# Patient Record
Sex: Female | Born: 1980
Health system: Southern US, Community
[De-identification: ages and names within clinical notes are randomized; demographics above are authoritative.]

## PROBLEM LIST (undated history)

## (undated) DIAGNOSIS — D649 Anemia, unspecified: Secondary | ICD-10-CM

## (undated) DIAGNOSIS — Z8489 Family history of other specified conditions: Secondary | ICD-10-CM

## (undated) DIAGNOSIS — R112 Nausea with vomiting, unspecified: Secondary | ICD-10-CM

## (undated) DIAGNOSIS — Z9889 Other specified postprocedural states: Secondary | ICD-10-CM

## (undated) HISTORY — PX: WISDOM TOOTH EXTRACTION: SHX21

## (undated) HISTORY — PX: MANDIBLE SURGERY: SHX707

---

## 2014-10-25 DIAGNOSIS — C4491 Basal cell carcinoma of skin, unspecified: Secondary | ICD-10-CM

## 2014-10-25 HISTORY — DX: Basal cell carcinoma of skin, unspecified: C44.91

## 2015-03-10 ENCOUNTER — Encounter (HOSPITAL_BASED_OUTPATIENT_CLINIC_OR_DEPARTMENT_OTHER): Payer: BLUE CROSS/BLUE SHIELD | Attending: Internal Medicine

## 2015-03-10 DIAGNOSIS — M868X8 Other osteomyelitis, other site: Secondary | ICD-10-CM | POA: Insufficient documentation

## 2019-07-13 ENCOUNTER — Inpatient Hospital Stay: Admission: RE | Admit: 2019-07-13 | Payer: BLUE CROSS/BLUE SHIELD | Source: Ambulatory Visit

## 2019-07-13 NOTE — H&P (Signed)
Sonia Conley is a 39 y.o. female here for Menstrual Problem .pt here for follow up for a long history of menorrhagia   Sonia Conley a 39 y.o.femalehere for Rf by Wayland Denis, PA-menorrhagia (Abnormal, heavy bleeding since July with pain.) Consult from Wayland Denis , PA.pt with a long h/o of aub from age 5 . Was on OCPs then stopped for year and she restarted on loestrin equivalent . She does have intermittent dyspareunia . Currently has 7 day cycles , not excessive  Recent u/s showed small fibroid 1 cm And right ovarian cyst 3 cm( decreased from last u/s  02/2019) She has been on Seasonale  For the past month bleed from 04/29/2019 to 05/29/2019   EMBX : neg   Past Medical History:  has a past medical history of Ovarian cyst.  Past Surgical History:  has a past surgical history that includes other surgery (02/09/2015). Family History: family history includes Allergies in her father and mother; Cancer in her maternal grandfather; Coronary Artery Disease (Blocked arteries around heart) in her father; Diabetes type II in her father; Glaucoma in her mother; Heart disease in her mother; High blood pressure (Hypertension) in her father; Hyperlipidemia (Elevated cholesterol) in her father; Myocardial Infarction (Heart attack) (age of onset: 97) in her father; No Known Problems in her brother and maternal grandmother; Ovarian cancer (age of onset: 3) in her paternal aunt; Ovarian cancer (age of onset: 49) in her sister; Ovarian cancer (age of onset: 22) in her paternal grandmother. Social History:  reports that she has never smoked. She has never used smokeless tobacco. She reports that she does not drink alcohol or use drugs. OB/GYN History:          OB History    Gravida  0   Para  0   Term  0   Preterm  0   AB  0   Living  0     SAB  0   TAB  0   Ectopic  0   Molar  0   Multiple  0   Live Births  0          Allergies: is allergic to penicillins and latex,  natural rubber. Medications:  Current Outpatient Medications:  .  fluticasone (FLONASE) 50 mcg/actuation nasal spray, Place 1 spray into both nostrils 2 (two) times daily, Disp: 16 g, Rfl: 0 .  latanoprost (XALATAN) 0.005 % ophthalmic solution, Place 1 drop into both eyes as directed. Reported on 06/09/2015 , Disp: , Rfl:  .  levonorgestrel-ethinyl estradiol (SEASONALE) 0.15 mg-30 mcg (91) tablet, Take 1 tablet by mouth once daily, Disp: 84 tablet, Rfl: 3 .  ondansetron (ZOFRAN) 8 MG tablet, Take 1 tablet (8 mg total) by mouth every 6 (six) hours as needed for Nausea, Disp: 20 tablet, Rfl: 0 .  montelukast (SINGULAIR) 10 mg tablet, Take 1 tablet (10 mg total) by mouth nightly, Disp: 30 tablet, Rfl: 1  Review of Systems: General:                      No fatigue or weight loss Eyes:                           No vision changes Ears:                            No hearing difficulty Respiratory:  No cough or shortness of breath Pulmonary:                  No asthma or shortness of breath Cardiovascular:           No chest pain, palpitations, dyspnea on exertion Gastrointestinal:          No abdominal bloating, chronic diarrhea, constipations, masses, pain or hematochezia Genitourinary:             No hematuria, dysuria, abnormal vaginal discharge, pelvic pain, Menometrorrhagia Lymphatic:                   No swollen lymph nodes Musculoskeletal:         No muscle weakness Neurologic:                  No extremity weakness, syncope, seizure disorder Psychiatric:                  No history of depression, delusions or suicidal/homicidal ideation    Exam:      Vitals:   06/03/19 1418  BP: 137/81  Pulse: 82    Body mass index is 28.9 kg/m.  WDWN white/female in NAD   Lungs: CTA  CV : RRR without murmur   Neck:  no thyromegaly Abdomen: soft , no mass, normal active bowel sounds,  non-tender, no rebound tenderness Pelvic: tanner stage 5 ,  External genitalia:  vulva /labia no lesions Urethra: no prolapse Vagina: normal physiologic d/c, adequate room for LAVH  Cervix: no lesions, no cervical motion tenderness   Uterus: normal size shape and contour, non-tender Adnexa: no mass,  non-tender     Impression:   The encounter diagnosis was Irregular menstrual bleeding, unspecified,. Bleeding not controlled with conservative management   Plan:   Pt elects for definitive surgery  ie LAVH  + bilateral salpingectomy  Benefits and risks to surgery: The proposed benefit of the surgery has been discussed with the patient. The possible risks include, but are not limited to: organ injury to the bowel , bladder, ureters, and major blood vessels and nerves. There is a possibility of additional surgeries resulting from these injuries. There is also the risk of blood transfusion and the need to receive blood products during or after the procedure which may rarely lead to HIV or Hepatitis C infection. There is a risk of developing a deep venous thrombosis or a pulmonary embolism . There is the possibility of wound infection and also anesthetic complications, even the rare possibility of death. The patient understands these risks and wishes to proceed. All questions have been answered  Information sheet given to the patient that explain the  surgery     Caroline Sauger, MD       Electronically signed by Devika Dragovich, Burman Blacksmith, MD

## 2019-07-14 ENCOUNTER — Other Ambulatory Visit: Payer: Self-pay

## 2019-07-14 ENCOUNTER — Encounter
Admission: RE | Admit: 2019-07-14 | Discharge: 2019-07-14 | Disposition: A | Discharge status: Home / Self Care | Payer: BC Managed Care – PPO | Source: Ambulatory Visit | Attending: Obstetrics and Gynecology | Admitting: Obstetrics and Gynecology

## 2019-07-14 HISTORY — DX: Other specified postprocedural states: Z98.890

## 2019-07-14 HISTORY — DX: Other specified postprocedural states: R11.2

## 2019-07-14 HISTORY — DX: Anemia, unspecified: D64.9

## 2019-07-14 HISTORY — DX: Family history of other specified conditions: Z84.89

## 2019-07-14 NOTE — Patient Instructions (Addendum)
Your procedure is scheduled on: 07/27/19 Report to [facility]. To find out your arrival time please call [phone] between 1PM - 3PM on 07/24/19 .  Remember: Instructions that are not followed completely may result in serious medical risk, up to and including death, or upon the discretion of your surgeon and anesthesiologist your surgery may need to be rescheduled.     _X__ 1. Do not eat food after midnight the night before your procedure.                 No gum chewing or hard candies. You may drink clear liquids up to 2 hours                 before you are scheduled to arrive for your surgery- DO not drink clear                 liquids within 2 hours of the start of your surgery.                 Clear Liquids include:  water, apple juice without pulp, clear carbohydrate                 drink such as Clearfast or Gatorade, Black Coffee or Tea (Do not add                 anything to coffee or tea). Diabetics water only  __X__2.  On the morning of surgery brush your teeth with toothpaste and water, you                 may rinse your mouth with mouthwash if you wish.  Do not swallow any              toothpaste of mouthwash.     _X__ 3.  No Alcohol for 24 hours before or after surgery.   _X__ 4.  Do Not Smoke or use e-cigarettes For 24 Hours Prior to Your Surgery.                 Do not use any chewable tobacco products for at least 6 hours prior to                 surgery.  ____  5.  Bring all medications with you on the day of surgery if instructed.   __X__  6.  Notify your doctor if there is any change in your medical condition      (cold, fever, infections).     Do not wear jewelry, make-up, hairpins, clips or nail polish. Do not wear lotions, powders, or perfumes.  Do not shave 48 hours prior to surgery. Men may shave face and neck. Do not bring valuables to the hospital.    [facility] is not responsible for any belongings or  valuables.  Contacts, dentures/partials or body piercings may not be worn into surgery. Bring a case for your contacts, glasses or hearing aids, a denture cup will be supplied. Leave your suitcase in the car. After surgery it may be brought to your room. For patients admitted to the hospital, discharge time is determined by your treatment team.   Patients discharged the day of surgery will not be allowed to drive home.   Please read over the following fact sheets that you were given:   MRSA Information  __X__ Take these medicines the morning of surgery with A SIP OF WATER:  1. none  2.   3.   4.  5.  6.  ____ Fleet Enema (as directed)   __X__ Use CHG Soap/SAGE wipes as directed  ____ Use inhalers on the day of surgery  ____ Stop metformin/Janumet/Farxiga 2 days prior to surgery    ____ Take 1/2 of usual insulin dose the night before surgery. No insulin the morning          of surgery.   ____ Stop Blood Thinners Coumadin/Plavix/Xarelto/Pleta/Pradaxa/Eliquis/Effient/Aspirin  on   Or contact your Surgeon, Cardiologist or Medical Doctor regarding  ability to stop your blood thinners  __X__ Stop Anti-inflammatories 7 days before surgery such as Advil, Ibuprofen, Motrin,  BC or Goodies Powder, Naprosyn, Naproxen, Aleve, Aspirin    __X__ Stop all herbal supplements, fish oil or vitamin E until after surgery.    ____ Bring C-Pap to the hospital.     The Ensure pre surgery drink needs to be completed 2 hours before arrival. If you have any questions regarding the Incentive Spirometer bring the day of procedure. Your pre or post op nurse can assist with any questions.

## 2019-07-22 ENCOUNTER — Ambulatory Visit: Payer: BC Managed Care – PPO | Attending: Internal Medicine

## 2019-07-22 ENCOUNTER — Other Ambulatory Visit: Payer: Self-pay

## 2019-07-22 ENCOUNTER — Other Ambulatory Visit
Admission: RE | Admit: 2019-07-22 | Discharge: 2019-07-22 | Disposition: A | Discharge status: Home / Self Care | Payer: BC Managed Care – PPO | Source: Ambulatory Visit | Attending: Obstetrics and Gynecology | Admitting: Obstetrics and Gynecology

## 2019-07-22 DIAGNOSIS — Z01812 Encounter for preprocedural laboratory examination: Secondary | ICD-10-CM | POA: Insufficient documentation

## 2019-07-22 DIAGNOSIS — Z20822 Contact with and (suspected) exposure to covid-19: Secondary | ICD-10-CM | POA: Diagnosis not present

## 2019-07-22 DIAGNOSIS — N920 Excessive and frequent menstruation with regular cycle: Secondary | ICD-10-CM | POA: Insufficient documentation

## 2019-07-22 LAB — CBC
HCT: 44.3 % (ref 36.0–46.0)
Hemoglobin: 14.5 g/dL (ref 12.0–15.0)
MCH: 28.7 pg (ref 26.0–34.0)
MCHC: 32.7 g/dL (ref 30.0–36.0)
MCV: 87.5 fL (ref 80.0–100.0)
Platelets: 311 10*3/uL (ref 150–400)
RBC: 5.06 MIL/uL (ref 3.87–5.11)
RDW: 11.9 % (ref 11.5–15.5)
WBC: 7.9 10*3/uL (ref 4.0–10.5)
nRBC: 0 % (ref 0.0–0.2)

## 2019-07-22 LAB — BASIC METABOLIC PANEL
Anion gap: 9 (ref 5–15)
BUN: 9 mg/dL (ref 6–20)
CO2: 26 mmol/L (ref 22–32)
Calcium: 9.1 mg/dL (ref 8.9–10.3)
Chloride: 105 mmol/L (ref 98–111)
Creatinine, Ser: 0.77 mg/dL (ref 0.44–1.00)
GFR calc Af Amer: >60 mL/min (ref >60)
GFR calc non Af Amer: >60 mL/min (ref >60)
Glucose, Bld: 97 mg/dL (ref 70–99)
Potassium: 4.1 mmol/L (ref 3.5–5.1)
Sodium: 140 mmol/L (ref 135–145)

## 2019-07-22 LAB — TYPE AND SCREEN
ABO/RH(D): A POS
Antibody Screen: NEGATIVE

## 2019-07-22 LAB — SARS CORONAVIRUS 2 (TAT 6-24 HRS): SARS Coronavirus 2: NEGATIVE

## 2019-07-27 ENCOUNTER — Ambulatory Visit: Payer: BC Managed Care – PPO | Admitting: Anesthesiology

## 2019-07-27 ENCOUNTER — Ambulatory Visit
Admission: RE | Admit: 2019-07-27 | Discharge: 2019-07-27 | Disposition: A | Discharge status: Home / Self Care | Payer: BC Managed Care – PPO | Attending: Obstetrics and Gynecology | Admitting: Obstetrics and Gynecology

## 2019-07-27 ENCOUNTER — Other Ambulatory Visit: Payer: Self-pay

## 2019-07-27 ENCOUNTER — Encounter: Payer: Self-pay | Admitting: Obstetrics and Gynecology

## 2019-07-27 ENCOUNTER — Encounter
Admission: RE | Disposition: A | Discharge status: Home / Self Care | Payer: Self-pay | Source: Home / Self Care | Attending: Obstetrics and Gynecology

## 2019-07-27 DIAGNOSIS — N838 Other noninflammatory disorders of ovary, fallopian tube and broad ligament: Secondary | ICD-10-CM | POA: Diagnosis not present

## 2019-07-27 DIAGNOSIS — N941 Unspecified dyspareunia: Secondary | ICD-10-CM | POA: Diagnosis not present

## 2019-07-27 DIAGNOSIS — N920 Excessive and frequent menstruation with regular cycle: Secondary | ICD-10-CM | POA: Insufficient documentation

## 2019-07-27 DIAGNOSIS — Z79899 Other long term (current) drug therapy: Secondary | ICD-10-CM | POA: Insufficient documentation

## 2019-07-27 DIAGNOSIS — Z793 Long term (current) use of hormonal contraceptives: Secondary | ICD-10-CM | POA: Insufficient documentation

## 2019-07-27 DIAGNOSIS — Z91040 Latex allergy status: Secondary | ICD-10-CM | POA: Diagnosis not present

## 2019-07-27 DIAGNOSIS — N803 Endometriosis of pelvic peritoneum: Secondary | ICD-10-CM | POA: Diagnosis not present

## 2019-07-27 DIAGNOSIS — Z8041 Family history of malignant neoplasm of ovary: Secondary | ICD-10-CM | POA: Diagnosis not present

## 2019-07-27 DIAGNOSIS — Z880 Allergy status to penicillin: Secondary | ICD-10-CM | POA: Insufficient documentation

## 2019-07-27 DIAGNOSIS — D252 Subserosal leiomyoma of uterus: Secondary | ICD-10-CM | POA: Diagnosis not present

## 2019-07-27 HISTORY — PX: LAPAROSCOPIC VAGINAL HYSTERECTOMY WITH SALPINGECTOMY: SHX6680

## 2019-07-27 HISTORY — PX: EXCISION OF ENDOMETRIOMA: SHX6473

## 2019-07-27 LAB — ABO/RH: ABO/RH(D): A POS

## 2019-07-27 LAB — POCT PREGNANCY, URINE: Preg Test, Ur: NEGATIVE

## 2019-07-27 SURGERY — HYSTERECTOMY, VAGINAL, LAPAROSCOPY-ASSISTED, WITH SALPINGECTOMY
Anesthesia: General | Laterality: Bilateral

## 2019-07-27 MED ORDER — PROMETHAZINE HCL 25 MG/ML IJ SOLN
INTRAMUSCULAR | Status: AC
  Administered 2019-07-27: 6.25 mg via INTRAVENOUS
  Filled 2019-07-27: qty 1

## 2019-07-27 MED ORDER — LACTATED RINGERS IV SOLN: INTRAVENOUS | Status: DC

## 2019-07-27 MED ORDER — SUCCINYLCHOLINE CHLORIDE 20 MG/ML IJ SOLN
INTRAMUSCULAR | Status: DC | PRN
  Administered 2019-07-27: 100 mg via INTRAVENOUS

## 2019-07-27 MED ORDER — GABAPENTIN 300 MG PO CAPS
ORAL_CAPSULE | ORAL | Status: AC
  Filled 2019-07-27: qty 1

## 2019-07-27 MED ORDER — LIDOCAINE HCL (CARDIAC) PF 100 MG/5ML IV SOSY
PREFILLED_SYRINGE | INTRAVENOUS | Status: DC | PRN
  Administered 2019-07-27: 50 mg via INTRAVENOUS

## 2019-07-27 MED ORDER — GLYCOPYRROLATE 0.2 MG/ML IJ SOLN
INTRAMUSCULAR | Status: AC
  Filled 2019-07-27: qty 1

## 2019-07-27 MED ORDER — OXYCODONE-ACETAMINOPHEN 5-325 MG PO TABS: 1.0000 | ORAL_TABLET | Freq: Four times a day (QID) | ORAL | Status: DC | PRN

## 2019-07-27 MED ORDER — ROCURONIUM BROMIDE 100 MG/10ML IV SOLN
INTRAVENOUS | Status: DC | PRN
  Administered 2019-07-27: 35 mg via INTRAVENOUS
  Administered 2019-07-27: 5 mg via INTRAVENOUS

## 2019-07-27 MED ORDER — PROPOFOL 10 MG/ML IV BOLUS
INTRAVENOUS | Status: AC
  Filled 2019-07-27: qty 20

## 2019-07-27 MED ORDER — SUGAMMADEX SODIUM 200 MG/2ML IV SOLN
INTRAVENOUS | Status: DC | PRN
  Administered 2019-07-27: 200 mg via INTRAVENOUS

## 2019-07-27 MED ORDER — MIDAZOLAM HCL 2 MG/2ML IJ SOLN
INTRAMUSCULAR | Status: DC | PRN
  Administered 2019-07-27: 2 mg via INTRAVENOUS

## 2019-07-27 MED ORDER — FENTANYL CITRATE (PF) 100 MCG/2ML IJ SOLN
INTRAMUSCULAR | Status: AC
  Filled 2019-07-27: qty 2

## 2019-07-27 MED ORDER — FAMOTIDINE 20 MG PO TABS
ORAL_TABLET | ORAL | Status: AC
  Administered 2019-07-27: 20 mg
  Filled 2019-07-27: qty 1

## 2019-07-27 MED ORDER — PROMETHAZINE HCL 25 MG/ML IJ SOLN
6.2500 mg | INTRAMUSCULAR | Status: AC | PRN
Start: 2019-07-27 — End: 2019-07-27
  Administered 2019-07-27: 16:00:00 6.25 mg via INTRAVENOUS

## 2019-07-27 MED ORDER — SODIUM CHLORIDE FLUSH 0.9 % IV SOLN
INTRAVENOUS | Status: AC
  Filled 2019-07-27: qty 10

## 2019-07-27 MED ORDER — BUPIVACAINE HCL 0.5 % IJ SOLN
INTRAMUSCULAR | Status: DC | PRN
  Administered 2019-07-27: 9 mL

## 2019-07-27 MED ORDER — DEXAMETHASONE SODIUM PHOSPHATE 10 MG/ML IJ SOLN
INTRAMUSCULAR | Status: AC
  Filled 2019-07-27: qty 1

## 2019-07-27 MED ORDER — OXYCODONE HCL 5 MG PO TABS
ORAL_TABLET | ORAL | Status: AC
  Administered 2019-07-27: 5 mg via ORAL
  Filled 2019-07-27: qty 1

## 2019-07-27 MED ORDER — ONDANSETRON HCL 4 MG/2ML IJ SOLN
INTRAMUSCULAR | Status: DC | PRN
  Administered 2019-07-27: 4 mg via INTRAVENOUS

## 2019-07-27 MED ORDER — BUPIVACAINE HCL (PF) 0.5 % IJ SOLN
INTRAMUSCULAR | Status: AC
  Filled 2019-07-27: qty 30

## 2019-07-27 MED ORDER — LIDOCAINE HCL (PF) 2 % IJ SOLN
INTRAMUSCULAR | Status: AC
  Filled 2019-07-27: qty 10

## 2019-07-27 MED ORDER — ACETAMINOPHEN 500 MG PO TABS
1000.0000 mg | ORAL_TABLET | ORAL | Status: AC
  Administered 2019-07-27: 1000 mg via ORAL

## 2019-07-27 MED ORDER — DEXMEDETOMIDINE HCL IN NACL 200 MCG/50ML IV SOLN
INTRAVENOUS | Status: DC | PRN
  Administered 2019-07-27: 10 ug via INTRAVENOUS

## 2019-07-27 MED ORDER — GENTAMICIN SULFATE 40 MG/ML IJ SOLN
5.0000 mg/kg | INTRAVENOUS | Status: AC
  Administered 2019-07-27: 310 mg via INTRAVENOUS
  Filled 2019-07-27: qty 7.75

## 2019-07-27 MED ORDER — GLYCOPYRROLATE 0.2 MG/ML IJ SOLN
INTRAMUSCULAR | Status: DC | PRN
  Administered 2019-07-27: .2 mg via INTRAVENOUS

## 2019-07-27 MED ORDER — FENTANYL CITRATE (PF) 100 MCG/2ML IJ SOLN
INTRAMUSCULAR | Status: DC | PRN
  Administered 2019-07-27 (×3): 50 ug via INTRAVENOUS

## 2019-07-27 MED ORDER — ONDANSETRON HCL 4 MG/2ML IJ SOLN
INTRAMUSCULAR | Status: AC
  Filled 2019-07-27: qty 2

## 2019-07-27 MED ORDER — ACETAMINOPHEN 500 MG PO TABS
ORAL_TABLET | ORAL | Status: AC
  Filled 2019-07-27: qty 2

## 2019-07-27 MED ORDER — OXYCODONE HCL 5 MG PO TABS: 5.0000 mg | ORAL_TABLET | Freq: Once | ORAL | Status: AC

## 2019-07-27 MED ORDER — DEXAMETHASONE SODIUM PHOSPHATE 10 MG/ML IJ SOLN
INTRAMUSCULAR | Status: DC | PRN
  Administered 2019-07-27: 10 mg via INTRAVENOUS

## 2019-07-27 MED ORDER — MIDAZOLAM HCL 2 MG/2ML IJ SOLN
INTRAMUSCULAR | Status: AC
  Filled 2019-07-27: qty 2

## 2019-07-27 MED ORDER — KETOROLAC TROMETHAMINE 30 MG/ML IJ SOLN
INTRAMUSCULAR | Status: DC | PRN
  Administered 2019-07-27: 30 mg via INTRAVENOUS

## 2019-07-27 MED ORDER — CLINDAMYCIN PHOSPHATE 900 MG/50ML IV SOLN
INTRAVENOUS | Status: AC
  Filled 2019-07-27: qty 50

## 2019-07-27 MED ORDER — LIDOCAINE-EPINEPHRINE 1 %-1:100000 IJ SOLN
INTRAMUSCULAR | Status: DC | PRN
  Administered 2019-07-27: 9 mL

## 2019-07-27 MED ORDER — SUGAMMADEX SODIUM 200 MG/2ML IV SOLN
INTRAVENOUS | Status: AC
  Filled 2019-07-27: qty 2

## 2019-07-27 MED ORDER — SCOPOLAMINE 1 MG/3DAYS TD PT72
MEDICATED_PATCH | TRANSDERMAL | Status: AC
  Filled 2019-07-27: qty 1

## 2019-07-27 MED ORDER — ONDANSETRON HCL 4 MG/2ML IJ SOLN: 4.0000 mg | Freq: Four times a day (QID) | INTRAMUSCULAR | Status: DC | PRN

## 2019-07-27 MED ORDER — LIDOCAINE-EPINEPHRINE 1 %-1:100000 IJ SOLN
INTRAMUSCULAR | Status: AC
  Filled 2019-07-27: qty 1

## 2019-07-27 MED ORDER — SCOPOLAMINE 1 MG/3DAYS TD PT72
1.0000 | MEDICATED_PATCH | Freq: Once | TRANSDERMAL | Status: DC
  Administered 2019-07-27: 1.5 mg via TRANSDERMAL

## 2019-07-27 MED ORDER — CLINDAMYCIN PHOSPHATE 900 MG/50ML IV SOLN
900.0000 mg | INTRAVENOUS | Status: AC
  Administered 2019-07-27: 900 mg via INTRAVENOUS

## 2019-07-27 MED ORDER — KETOROLAC TROMETHAMINE 30 MG/ML IJ SOLN
INTRAMUSCULAR | Status: AC
  Filled 2019-07-27: qty 1

## 2019-07-27 MED ORDER — GABAPENTIN 300 MG PO CAPS
300.0000 mg | ORAL_CAPSULE | ORAL | Status: AC
  Administered 2019-07-27: 300 mg via ORAL

## 2019-07-27 MED ORDER — PROPOFOL 10 MG/ML IV BOLUS
INTRAVENOUS | Status: DC | PRN
  Administered 2019-07-27: 150 mg via INTRAVENOUS

## 2019-07-27 MED ORDER — FENTANYL CITRATE (PF) 100 MCG/2ML IJ SOLN: 25.0000 ug | INTRAMUSCULAR | Status: DC | PRN

## 2019-07-27 SURGICAL SUPPLY — 48 items
BAG URINE DRAIN 2000ML AR STRL (UROLOGICAL SUPPLIES) ×4 IMPLANT
BLADE SURG SZ11 CARB STEEL (BLADE) ×4 IMPLANT
CATH FOLEY 2WAY  5CC 16FR (CATHETERS) ×2
CATH ROBINSON RED A/P 16FR (CATHETERS) ×4 IMPLANT
CATH URTH 16FR FL 2W BLN LF (CATHETERS) ×2 IMPLANT
CHLORAPREP W/TINT 26 (MISCELLANEOUS) ×4 IMPLANT
CLOSURE WOUND 1/2 X4 (GAUZE/BANDAGES/DRESSINGS) ×1
COVER WAND RF STERILE (DRAPES) ×4 IMPLANT
DRAPE SURG 17X11 SM STRL (DRAPES) ×4 IMPLANT
DRSG TEGADERM 2-3/8X2-3/4 SM (GAUZE/BANDAGES/DRESSINGS) ×16 IMPLANT
ELECT REM PT RETURN 9FT ADLT (ELECTROSURGICAL) ×4
ELECTRODE REM PT RTRN 9FT ADLT (ELECTROSURGICAL) ×2 IMPLANT
FILTER LAP SMOKE EVAC STRL (MISCELLANEOUS) ×4 IMPLANT
GAUZE 4X4 16PLY RFD (DISPOSABLE) ×4 IMPLANT
GLOVE BIO SURGEON STRL SZ8 (GLOVE) ×16 IMPLANT
GOWN STRL REUS W/ TWL LRG LVL3 (GOWN DISPOSABLE) ×6 IMPLANT
GOWN STRL REUS W/ TWL XL LVL3 (GOWN DISPOSABLE) ×2 IMPLANT
GOWN STRL REUS W/TWL LRG LVL3 (GOWN DISPOSABLE) ×6
GOWN STRL REUS W/TWL XL LVL3 (GOWN DISPOSABLE) ×2
GRASPER SUT TROCAR 14GX15 (MISCELLANEOUS) IMPLANT
IRRIGATION STRYKERFLOW (MISCELLANEOUS) ×2 IMPLANT
IRRIGATOR STRYKERFLOW (MISCELLANEOUS) ×4
IV LACTATED RINGERS 1000ML (IV SOLUTION) ×4 IMPLANT
KIT PINK PAD W/HEAD ARE REST (MISCELLANEOUS) ×4
KIT PINK PAD W/HEAD ARM REST (MISCELLANEOUS) ×2 IMPLANT
KIT TURNOVER CYSTO (KITS) ×4 IMPLANT
LABEL OR SOLS (LABEL) ×4 IMPLANT
NEEDLE HYPO 22GX1.5 SAFETY (NEEDLE) ×4 IMPLANT
PACK BASIN MINOR ARMC (MISCELLANEOUS) ×4 IMPLANT
PACK GYN LAPAROSCOPIC (MISCELLANEOUS) ×4 IMPLANT
PAD OB MATERNITY 4.3X12.25 (PERSONAL CARE ITEMS) ×4 IMPLANT
SHEARS HARMONIC ACE PLUS 36CM (ENDOMECHANICALS) ×4 IMPLANT
SLEEVE ENDOPATH XCEL 5M (ENDOMECHANICALS) ×8 IMPLANT
SPONGE GAUZE 2X2 8PLY STER LF (GAUZE/BANDAGES/DRESSINGS) ×3
SPONGE GAUZE 2X2 8PLY STRL LF (GAUZE/BANDAGES/DRESSINGS) ×9 IMPLANT
STRIP CLOSURE SKIN 1/2X4 (GAUZE/BANDAGES/DRESSINGS) ×3 IMPLANT
SUT VIC AB 0 CT1 27 (SUTURE) ×4
SUT VIC AB 0 CT1 27XCR 8 STRN (SUTURE) ×4 IMPLANT
SUT VIC AB 0 CT1 36 (SUTURE) ×8 IMPLANT
SUT VIC AB 0 CT2 27 (SUTURE) ×4 IMPLANT
SUT VIC AB 2-0 UR6 27 (SUTURE) IMPLANT
SUT VIC AB 4-0 SH 27 (SUTURE) ×2
SUT VIC AB 4-0 SH 27XANBCTRL (SUTURE) ×2 IMPLANT
SUT VICRYL 2-0 SH 8X27 (SUTURE) ×4 IMPLANT
SYR 10ML LL (SYRINGE) ×4 IMPLANT
SYR CONTROL 10ML LL (SYRINGE) ×4 IMPLANT
TROCAR XCEL NON-BLD 5MMX100MML (ENDOMECHANICALS) ×4 IMPLANT
TUBING EVAC SMOKE HEATED PNEUM (TUBING) ×4 IMPLANT

## 2019-07-27 NOTE — Op Note (Signed)
NAME: DARLETTE, ROCIO MEDICAL RECORD [id number] ACCOUNT [id number] DATE OF BIRTH:1981-04-27 FACILITY: [facility] LOCATION: [facility] PHYSICIAN:Jaivion Kingsley Josefine Class, MD  OPERATIVE REPORT  DATE OF PROCEDURE:  07/27/2019  PREOPERATIVE DIAGNOSES: 1.  Menorrhagia. 2.  Dyspareunia.  POSTOPERATIVE DIAGNOSES: 1.  Menorrhagia. 2.  Dyspareunia. 3.  Peritoneal endometriosis.  PROCEDURES: 1.  Laparoscopic assisted vaginal hysterectomy. 2.  Bilateral salpingectomy. 3.  Excision of peritoneal endometriosis.  SURGEON:  Laverta Baltimore, MD.  FIRST ASSISTANT:  Dr. Leafy Ro  ANESTHESIA:  General endotracheal anesthesia.  INDICATIONS:  A 39 year old gravida 0 patient with a long history of menorrhagia and intermittent dyspareunia, failing conservative treatment.  FINDINGS:  Peritoneal scarring consistent with endometriosis, one area to the posterior right cul-de-sac and the other significant area anterior cul-de-sac on the right.  DESCRIPTION OF PROCEDURE:  After adequate general endotracheal anesthesia, the patient was placed in dorsal supine position, legs in the [place] stirrups.  The patient's abdomen, perineum and vagina were prepped and draped in normal sterile fashion.   Timeout was performed.  Straight catheterization of the bladder yielded 125 mL of clear urine.  A Foley catheter was kept in during the laparoscopic portion of the procedure.  Gloves were changed.  Attention was directed to the patient's abdomen where a  5 mm infraumbilical incision was made after injecting with 0.5% Marcaine.  The 5 mm laparoscope was advanced into the abdominal cavity under direct visualization with the Optiview cannula.  A second port site was placed in the left lower quadrant 3 cm  medial to the left anterior iliac spine.  Under direct visualization, the trocar was advanced.  A third port site was placed in the right lower quadrant, again 3 cm medial to the right anterior iliac spine and under  direct visualization, the trocar was  advanced.  Initial impression revealed several areas with scarification and powder burn areas consistent with endometriosis, a large area in the right anterior cul-de-sac and in the posterior cul-de-sac on the right as well overlying the track of the  ureter.  Excisional biopsies were then performed with the Harmonic scalpel with tenting the peritoneum away from the underneath structures and dissecting this endometriosis out.  Both specimens will be sent for pathologic evaluation.  Attention was then  directed to the patient's left fallopian tube, which was grasped with the fimbriated end and the mesosalpinx was dissected to the level of the cornua.  The uteroovarian ligament was then cauterized and transected, as well as the broad ligament.  The left  uterine artery was skeletonized after opening the bladder flap to the central portion of the cervix.  The uterine artery was then cauterized and transected with Harmonic scalpel.  Of note, the ureters were identified bilaterally with normal peristaltic  activity prior to dissection.  A similar procedure was performed on the patient's right side.  Again after picking up the fimbriated end of the right fallopian tube, the mesosalpinx was dissected free all the way to the level of the cornua.  The  uteroovarian ligament was cauterized, transected, as well as the broad ligament.  The right uterine artery was skeletonized, cauterized and transected.  Good hemostasis was noted.  The rest of the bladder flap was opened and the bladder was reflected off  the lower cervix.  Attention was then directed vaginally where the legs were placed steeply and a weighted speculum was placed in the posterior vaginal vault.  The Foley catheter was removed.  Cervix was grasped with 2 thyroid tenacula and injected with  1% lidocaine with 1:100,000 epinephrine.  A direct posterior colpotomy incision was made.  Upon entry into the posterior  cul-de-sac, some serosanguineous discharge was removed.  The uterosacral ligaments were bilaterally clamped, transected and suture  ligated for later identification.  The anterior cervix was circumferentially incised with the Bovie.  The anterior cul-de-sac was entered sharply without difficulty.  The cardinal ligaments were then bilaterally clamped, transected, suture ligated with 0  Vicryl suture and 1 additional bite on each side allowed for delivery of the cervix and the uterus and fallopian tubes.  Good hemostasis was noted.  Vaginal cuff was then closed with a running 0 Vicryl suture.  Good approximation of edges, good  hemostasis.  Gloves were changed.  Attention was directed back up to the abdominal cavity and the patient's abdomen was reinsufflated.  Good hemostasis was noted.  The previously biopsied peritoneal sites were hemostatic.  The ureters appeared normal  with normal peristaltic activity.  Irrigation was performed and no additional bleeding was noted.  Pressure was lowered to 7 mmHg.  Again, good hemostasis.  Upper abdomen appeared normal and the patient's abdomen was then deflated and the abdominal  incisions were closed with interrupted 4-0 Vicryl suture.  Sterile dressing applied.  The patient's bladder was catheterized at the end of the case, yielding an additional 25 mL of urine.  The patient tolerated the procedure well and was taken to  recovery room in good condition.  ESTIMATED BLOOD LOSS:  10 mL.  INTRAOPERATIVE FLUIDS:  700 mL.  URINE OUTPUT:  150 mL.  The patient did receive 30 mg of intravenous Tylenol prior to leaving the operating room.  VN/NUANCE  D:07/27/2019 T:07/27/2019 JOB:[id number]

## 2019-07-27 NOTE — Anesthesia Procedure Notes (Signed)
Procedure Name: Intubation Performed by: Merdis Snodgrass, CRNA Pre-anesthesia Checklist: Patient identified, Patient being monitored, Timeout performed, Emergency Drugs available and Suction available Patient Re-evaluated:Patient Re-evaluated prior to induction Oxygen Delivery Method: Circle system utilized Preoxygenation: Pre-oxygenation with 100% oxygen Induction Type: IV induction Ventilation: Mask ventilation without difficulty Laryngoscope Size: Miller and 2 Grade View: Grade I Tube type: Oral Tube size: 7.0 mm Number of attempts: 1 Airway Equipment and Method: Stylet Placement Confirmation: ETT inserted through vocal cords under direct vision,  positive ETCO2 and breath sounds checked- equal and bilateral Secured at: 21 cm Tube secured with: Tape Dental Injury: Teeth and Oropharynx as per pre-operative assessment        

## 2019-07-27 NOTE — Anesthesia Preprocedure Evaluation (Signed)
Anesthesia Evaluation  Patient identified by MRN, date of birth, ID band Patient awake    Reviewed: Allergy & Precautions, H&P , NPO status , Patient's Chart, lab work & pertinent test results, reviewed documented beta blocker date and time   History of Anesthesia Complications (+) PONV, Family history of anesthesia reaction and history of anesthetic complications  Airway Mallampati: I  TM Distance: >3 FB Neck ROM: full    Dental  (+) Dental Advidsory Given, Caps, Teeth Intact, Missing   Pulmonary neg pulmonary ROS,    Pulmonary exam normal        Cardiovascular Exercise Tolerance: Good negative cardio ROS Normal cardiovascular exam     Neuro/Psych negative neurological ROS  negative psych ROS   GI/Hepatic negative GI ROS, Neg liver ROS,   Endo/Other  negative endocrine ROS  Renal/GU negative Renal ROS  negative genitourinary   Musculoskeletal   Abdominal   Peds  Hematology  (+) Blood dyscrasia, anemia ,   Anesthesia Other Findings Past Medical History: No date: Anemia No date: Family history of adverse reaction to anesthesia     Comment:  grandmother PONV No date: PONV (postoperative nausea and vomiting)   Reproductive/Obstetrics negative OB ROS                             Anesthesia Physical Anesthesia Plan  ASA: I  Anesthesia Plan: General   Post-op Pain Management:    Induction: Intravenous  PONV Risk Score and Plan: 4 or greater and Ondansetron, Dexamethasone, Midazolam, Scopolamine patch - Pre-op, Promethazine and Treatment may vary due to age or medical condition  Airway Management Planned: Oral ETT  Additional Equipment:   Intra-op Plan:   Post-operative Plan: Extubation in OR  Informed Consent: I have reviewed the patients History and Physical, chart, labs and discussed the procedure including the risks, benefits and alternatives for the proposed anesthesia with  the patient or authorized representative who has indicated his/her understanding and acceptance.     Dental Advisory Given  Plan Discussed with: Anesthesiologist, CRNA and Surgeon  Anesthesia Plan Comments:         Anesthesia Quick Evaluation

## 2019-07-27 NOTE — Transfer of Care (Signed)
Immediate Anesthesia Transfer of Care Note  Patient: Sonia Conley  Procedure(s) Performed: LAPAROSCOPIC ASSISTED VAGINAL HYSTERECTOMY WITH SALPINGECTOMY (Bilateral )  Patient Location: PACU  Anesthesia Type:General  Level of Consciousness: sedated  Airway & Oxygen Therapy: Patient Spontanous Breathing and Patient connected to face mask oxygen  Post-op Assessment: Report given to RN and Post -op Vital signs reviewed and stable  Post vital signs: Reviewed  Last Vitals:  Vitals Value Taken Time  BP 101/54 07/27/19 1455  Temp    Pulse 73 07/27/19 1456  Resp 17 07/27/19 1456  SpO2 96 % 07/27/19 1456  Vitals shown include unvalidated device data.  Last Pain:  Vitals:   07/27/19 1117  TempSrc: Temporal  PainSc: 0-No pain         Complications: No apparent anesthesia complications

## 2019-07-27 NOTE — Discharge Instructions (Addendum)
Laparoscopically Assisted Vaginal Hysterectomy, Care After This sheet gives you information about how to care for yourself after your procedure. Your health care provider may also give you more specific instructions. If you have problems or questions, contact your health care provider. What can I expect after the procedure? After the procedure, it is common to have:  Soreness and numbness in your incision areas.  Abdominal pain. You will be given pain medicine to control it.  Vaginal bleeding and discharge. You will need to use a sanitary napkin after this procedure.  Sore throat from the breathing tube that was inserted during surgery. Follow these instructions at home: Medicines  Take over-the-counter and prescription medicines only as told by your health care provider.  Do not take aspirin or ibuprofen. These medicines can cause bleeding.  Do not drive or use heavy machinery while taking prescription pain medicine.  Do not drive for 24 hours if you were given a medicine to help you relax (sedative) during the procedure. Incision care   Follow instructions from your health care provider about how to take care of your incisions. Make sure you: ? Wash your hands with soap and water before you change your bandage (dressing). If soap and water are not available, use hand sanitizer. ? Change your dressing as told by your health care provider. ? Leave stitches (sutures), skin glue, or adhesive strips in place. These skin closures may need to stay in place for 2 weeks or longer. If adhesive strip edges start to loosen and curl up, you may trim the loose edges. Do not remove adhesive strips completely unless your health care provider tells you to do that.  Check your incision area every day for signs of infection. Check for: ? Redness, swelling, or pain. ? Fluid or blood. ? Warmth. ? Pus or a bad smell. Activity  Get regular exercise as told by your health care provider. You may be  told to take short walks every day and go farther each time.  Return to your normal activities as told by your health care provider. Ask your health care provider what activities are safe for you.  Do not douche, use tampons, or have sexual intercourse for at least 6 weeks, or until your health care provider gives you permission.  Do not lift anything that is heavier than 10 lb (4.5 kg), or the limit that your health care provider tells you, until he or she says that it is safe. General instructions  Do not take baths, swim, or use a hot tub until your health care provider approves. Take showers instead of baths.  Do not drive for 24 hours if you received a sedative.  Do not drive or operate heavy machinery while taking prescription pain medicine.  To prevent or treat constipation while you are taking prescription pain medicine, your health care provider may recommend that you: ? Drink enough fluid to keep your urine clear or pale yellow. ? Take over-the-counter or prescription medicines. ? Eat foods that are high in fiber, such as fresh fruits and vegetables, whole grains, and beans. ? Limit foods that are high in fat and processed sugars, such as fried and sweet foods.  Keep all follow-up visits as told by your health care provider. This is important. Contact a health care provider if:  You have signs of infection, such as: ? Redness, swelling, or pain around your incision sites. ? Fluid or blood coming from an incision. ? An incision that feels warm to the   touch. ? Pus or a bad smell coming from an incision.  Your incision breaks open.  Your pain medicine is not helping.  You feel dizzy or light-headed.  You have pain or bleeding when you urinate.  You have persistent nausea and vomiting.  You have blood, pus, or a bad-smelling discharge from your vagina. Get help right away if:  You have a fever.  You have severe abdominal pain.  You have chest pain.  You have  shortness of breath.  You faint.  You have pain, swelling, or redness in your leg.  You have heavy bleeding from your vagina. Summary  After the procedure, it is common to have abdominal pain and vaginal bleeding.  You should not drive or lift heavy objects until your health care provider says that it is safe.  Contact your health care provider if you have any symptoms of infection, excessive vaginal bleeding, nausea, vomiting, or shortness of breath. This information is not intended to replace advice given to you by your health care provider. Make sure you discuss any questions you have with your health care provider. Document Revised: 05/24/2017 Document Reviewed: 08/07/2016 Elsevier Patient Education  2020 [employer]   AMBULATORY SURGERY  DISCHARGE INSTRUCTIONS   1) The drugs that you were given will stay in your system until tomorrow so for the next 24 hours you should not:  A) Drive an automobile B) Make any legal decisions C) Drink any alcoholic beverage   2) You may resume regular meals tomorrow.  Today it is better to start with liquids and gradually work up to solid foods.  You may eat anything you prefer, but it is better to start with liquids, then soup and crackers, and gradually work up to solid foods.   3) Please notify your doctor immediately if you have any unusual bleeding, trouble breathing, redness and pain at the surgery site, drainage, fever, or pain not relieved by medication.    4) Additional Instructions:        Please contact your physician with any problems or Same Day Surgery at [phone], Monday through Friday 6 am to 4 pm, or [facility] [place] Main number at [phone]. 

## 2019-07-27 NOTE — Brief Op Note (Signed)
07/27/2019  2:39 PM  PATIENT:  Sonia Conley  39 y.o. female  PRE-OPERATIVE DIAGNOSIS:  Menorrhagia, dyspareunia   POST-OPERATIVE DIAGNOSIS:  Menorrhagia, dyspareunia , endometriosis  PROCEDURE:  Procedure(s): LAPAROSCOPIC ASSISTED VAGINAL HYSTERECTOMY WITH SALPINGECTOMY (Bilateral) Excision of endometriosis SURGEON:  Surgeon(s) and Role:    * Kari Kerth, Gwen Her, MD - Primary    * Benjaman Kindler, MD - Assisting  PHYSICIAN ASSISTANT: CST  ASSISTANTS: none   ANESTHESIA:   general  EBL:  10 mL , IOF 700cc  urine out : 150 cc  BLOOD ADMINISTERED:none  DRAINS: none   LOCAL MEDICATIONS USED:  MARCAINE     SPECIMEN:  Source of Specimen:  cx , uterus , bilateral fallopian tubes and 2 separate excsionall biopsy of endometriosis  DISPOSITION OF SPECIMEN:  PATHOLOGY  COUNTS:  YES  TOURNIQUET:  * No tourniquets in log *  DICTATION: .Other Dictation: Dictation Number verbal  PLAN OF CARE: Discharge to home after PACU  PATIENT DISPOSITION:  PACU - hemodynamically stable.   Delay start of Pharmacological VTE agent (>24hrs) due to surgical blood loss or risk of bleeding: not applicable

## 2019-07-27 NOTE — Progress Notes (Signed)
Pt is ready for surgery . LAbs reviewed . All questions answered

## 2019-07-29 LAB — SURGICAL PATHOLOGY

## 2019-07-29 NOTE — Anesthesia Postprocedure Evaluation (Signed)
Anesthesia Post Note  Patient: Sonia Conley  Procedure(s) Performed: LAPAROSCOPIC ASSISTED VAGINAL HYSTERECTOMY WITH SALPINGECTOMY (Bilateral ) EXCISION OF ENDOMETRIOIS/EXCISIONAL BIOPSY  Patient location during evaluation: PACU Anesthesia Type: General Level of consciousness: awake and alert Pain management: pain level controlled Vital Signs Assessment: post-procedure vital signs reviewed and stable Respiratory status: spontaneous breathing, nonlabored ventilation, respiratory function stable and patient connected to nasal cannula oxygen Cardiovascular status: blood pressure returned to baseline and stable Postop Assessment: no apparent nausea or vomiting Anesthetic complications: no     Last Vitals:  Vitals:   07/27/19 1652 07/27/19 1719  BP: (!) 143/90 (!) 144/82  Pulse: 95 96  Resp: (!) 24 17  Temp:  (!) 36.3 C  SpO2: 98% 100%    Last Pain:  Vitals:   07/28/19 0816  TempSrc:   PainSc: 0-No pain                 Martha Clan

## 2020-01-13 ENCOUNTER — Other Ambulatory Visit: Payer: Self-pay

## 2020-01-13 ENCOUNTER — Ambulatory Visit: Payer: BC Managed Care – PPO | Admitting: Dermatology

## 2020-01-13 DIAGNOSIS — L719 Rosacea, unspecified: Secondary | ICD-10-CM | POA: Diagnosis not present

## 2020-01-13 MED ORDER — RHOFADE 1 % EX CREA: TOPICAL_CREAM | CUTANEOUS | 5 refills | Status: AC

## 2020-01-13 MED ORDER — DOXYCYCLINE MONOHYDRATE 150 MG PO TABS: 150.0000 mg | ORAL_TABLET | Freq: Every day | ORAL | 2 refills | Status: AC

## 2020-01-13 MED ORDER — IVERMECTIN 1 % EX CREA: TOPICAL_CREAM | CUTANEOUS | 5 refills | Status: AC

## 2020-01-13 NOTE — Progress Notes (Signed)
   Follow-Up Visit   Subjective  Sonia Conley is a 39 y.o. female who presents for the following: Rosacea.  Patient here today with rosacea flare. She was taking doxycycline 50 mg but has been out of it. She was also using Rhofade but currently not using anything since ran out of her medicines.  Patient does have some redness and grittiness with her eyes.  Patient had a complete hysterectomy in February 2021 and is on HRT.   The following portions of the chart were reviewed this encounter and updated as appropriate:      Review of Systems:  No other skin or systemic complaints except as noted in HPI or Assessment and Plan.  Objective  Well appearing patient in no apparent distress; mood and affect are within normal limits.  A focused examination was performed including face. Relevant physical exam findings are noted in the Assessment and Plan.  Objective  Face: Mid face erythema with telangiectasias +/- scattered inflammatory papules. Erythema of conjunctiva BL   Assessment & Plan  Rosacea Face  Flare with ocular involvement Start doxycycline 150mg  1 by mouth once daily as directed with food and drink #30 2RF, pt instructed to take 2/3s of pill (100 mg) for 2 weeks, then decrease to 50 mg qd  Start Soolantra cream to face at bedtime  Start Rhofade cream to face in morning and continue sunscreen daily  Doxycycline should be taken with food to prevent nausea. Do not lay down for 30 minutes after taking. Be cautious with sun exposure and use good sun protection while on this medication. Pregnant women should not take this medication.     Ordered Medications: Oxymetazoline HCl (RHOFADE) 1 % CREA Ivermectin (SOOLANTRA) 1 % CREA doxycycline (ADOXA) 150 MG tablet  Return in about 2 months (around 03/15/2020) for Rosacea.  Graciella Belton, RMA, am acting as scribe for Brendolyn Patty, MD . Documentation: I have reviewed the above documentation for accuracy and completeness, and  I agree with the above.  Brendolyn Patty MD

## 2020-01-13 NOTE — Patient Instructions (Addendum)
Rosacea  What is rosacea? Rosacea (say: ro-zay-sha) is a common skin disease that usually begins as a trend of flushing or blushing easily.  As rosacea progresses, a persistent redness in the center of the face will develop and may gradually spread beyond the nose and cheeks to the forehead and chin.  In some cases, the ears, chest, and back could be affected.  Rosacea may appear as tiny blood vessels or small red bumps that occur in crops.  Frequently they can contain pus, and are called "pustules".  If the bumps do not contain pus, they are referred to as "papules".  Rarely, in prolonged, untreated cases of rosacea, the oil glands of the nose and cheeks may become permanently enlarged.  This is called rhinophyma, and is seen more frequently in men.  Signs and Risks In its beginning stages, rosacea tends to come and go, which makes it difficult to recognize.  It can start as intermittent flushing of the face.  Eventually, blood vessels may become permanently visible.  Pustules and papules can appear, but can be mistaken for adult acne.  People of all races, ages, genders and ethnic groups are at risk of developing rosacea.  However, it is more common in women (especially around menopause) and adults with fair skin between the ages of 30 and 50.  Treatment Dermatologists typically recommend a combination of treatments to effectively manage rosacea.  Treatment can improve symptoms and may stop the progression of the rosacea.  Treatment may involve both topical and oral medications.  The tetracycline antibiotics are often used for their anti-inflammatory effect; however, because of the possibility of developing antibiotic resistance, they should not be used long term at full dose.  For dilated blood vessels the options include electrodessication (uses electric current through a small needle), laser treatment, and cosmetics to hide the redness.   With all forms of treatment, improvement is a slow process, and  patients may not see any results for the first 3-4 weeks.  It is very important to avoid the sun and other triggers.  Patients must wear sunscreen daily.  Skin Care Instructions: 1. Cleanse the skin with a mild soap such as CeraVe cleanser, Cetaphil cleanser, or Dove soap once or twice daily as needed. 2. Moisturize with Eucerin Redness Relief Daily Perfecting Lotion (has a subtle green tint), CeraVe Moisturizing Cream, or Oil of Olay Daily Moisturizer with sunscreen every morning and/or night as recommended. 3. Makeup should be "non-comedogenic" (won't clog pores) and be labeled "for sensitive skin". Good choices for cosmetics are: Neutrogena, Almay, and Physician's Formula.  Any product with a green tint tends to offset a red complexion. 4. If your eyes are dry and irritated, use artificial tears 2-3 times per day and cleanse the eyelids daily with baby shampoo.  Have your eyes examined at least every 2 years.  Be sure to tell your eye doctor that you have rosacea. 5. Alcoholic beverages tend to cause flushing of the skin, and may make rosacea worse. 6. Always wear sunscreen, protect your skin from extreme hot and cold temperatures, and avoid spicy foods, hot drinks, and mechanical irritation such as rubbing, scrubbing, or massaging the face.  Avoid harsh skin cleansers, cleansing masks, astringents, and exfoliation. If a particular product burns or makes your face feel tight, then it is likely to flare your rosacea. 7. If you are having difficulty finding a sunscreen that you can tolerate, you may try switching to a chemical-free sunscreen.  These are ones whose active   ingredient is zinc oxide or titanium dioxide only.  They should also be fragrance free, non-comedogenic, and labeled for sensitive skin. 8. Rosacea triggers may vary from person to person.  There are a variety of foods that have been reported to trigger rosacea.  Some patients find that keeping a diary of what they were doing when they  flared helps them avoid triggers.  Doxycycline should be taken with food to prevent nausea. Do not lay down for 30 minutes after taking. Be cautious with sun exposure and use good sun protection while on this medication. Pregnant women should not take this medication.   *Start doxycycline 100mg  once daily (2/3 of pill) with food until improved then decrease to 50mg  daily (1/3 of pill).  Your medications have been sent to [facility] and will be mailed to you after you call them and confirm your information with them.   [facility] [phone] [address], [place], [place] [address]

## 2020-04-13 ENCOUNTER — Other Ambulatory Visit: Payer: Self-pay | Admitting: Student

## 2020-04-13 ENCOUNTER — Other Ambulatory Visit: Payer: Self-pay | Admitting: Family Medicine

## 2020-04-13 DIAGNOSIS — Z1231 Encounter for screening mammogram for malignant neoplasm of breast: Secondary | ICD-10-CM

## 2020-04-18 ENCOUNTER — Other Ambulatory Visit: Payer: Self-pay

## 2020-04-18 ENCOUNTER — Ambulatory Visit
Admission: RE | Admit: 2020-04-18 | Discharge: 2020-04-18 | Disposition: A | Discharge status: Home / Self Care | Payer: BC Managed Care – PPO | Source: Ambulatory Visit | Attending: Student | Admitting: Student

## 2020-04-18 DIAGNOSIS — Z1231 Encounter for screening mammogram for malignant neoplasm of breast: Secondary | ICD-10-CM | POA: Diagnosis present

## 2020-04-22 ENCOUNTER — Other Ambulatory Visit: Payer: Self-pay | Admitting: Student

## 2020-04-22 DIAGNOSIS — R928 Other abnormal and inconclusive findings on diagnostic imaging of breast: Secondary | ICD-10-CM

## 2020-04-22 DIAGNOSIS — N6489 Other specified disorders of breast: Secondary | ICD-10-CM

## 2020-05-04 ENCOUNTER — Ambulatory Visit
Admission: RE | Admit: 2020-05-04 | Discharge: 2020-05-04 | Disposition: A | Discharge status: Home / Self Care | Payer: BC Managed Care – PPO | Source: Ambulatory Visit | Attending: Student | Admitting: Student

## 2020-05-04 ENCOUNTER — Other Ambulatory Visit: Payer: Self-pay

## 2020-05-04 DIAGNOSIS — R928 Other abnormal and inconclusive findings on diagnostic imaging of breast: Secondary | ICD-10-CM

## 2020-05-04 DIAGNOSIS — N6489 Other specified disorders of breast: Secondary | ICD-10-CM | POA: Diagnosis present

## 2020-05-05 ENCOUNTER — Other Ambulatory Visit: Payer: Self-pay | Admitting: Student

## 2020-05-05 DIAGNOSIS — R599 Enlarged lymph nodes, unspecified: Secondary | ICD-10-CM

## 2020-05-05 DIAGNOSIS — R928 Other abnormal and inconclusive findings on diagnostic imaging of breast: Secondary | ICD-10-CM

## 2020-08-04 ENCOUNTER — Other Ambulatory Visit: Payer: Self-pay

## 2020-08-04 ENCOUNTER — Ambulatory Visit
Admission: RE | Admit: 2020-08-04 | Discharge: 2020-08-04 | Disposition: A | Discharge status: Home / Self Care | Payer: BC Managed Care – PPO | Source: Ambulatory Visit | Attending: Student | Admitting: Student

## 2020-08-04 DIAGNOSIS — R599 Enlarged lymph nodes, unspecified: Secondary | ICD-10-CM | POA: Insufficient documentation

## 2020-08-04 DIAGNOSIS — R928 Other abnormal and inconclusive findings on diagnostic imaging of breast: Secondary | ICD-10-CM | POA: Insufficient documentation

## 2020-09-26 ENCOUNTER — Other Ambulatory Visit: Payer: Self-pay

## 2020-09-26 ENCOUNTER — Ambulatory Visit: Payer: BC Managed Care – PPO | Admitting: Dermatology

## 2020-09-26 DIAGNOSIS — D2339 Other benign neoplasm of skin of other parts of face: Secondary | ICD-10-CM | POA: Diagnosis not present

## 2020-09-26 DIAGNOSIS — D2330 Other benign neoplasm of skin of unspecified part of face: Secondary | ICD-10-CM

## 2020-09-26 MED ORDER — METRONIDAZOLE 0.75 % EX CREA: TOPICAL_CREAM | Freq: Two times a day (BID) | CUTANEOUS | 1 refills | Status: AC

## 2020-09-26 NOTE — Patient Instructions (Signed)

## 2020-09-26 NOTE — Progress Notes (Signed)
   Follow-Up Visit   Subjective  Sonia Conley is a 40 y.o. female who presents for the following: check spot (R nose, 76m, hx of bleeding when pt picks at).   The following portions of the chart were reviewed this encounter and updated as appropriate:       Review of Systems:  No other skin or systemic complaints except as noted in HPI or Assessment and Plan.  Objective  Well appearing patient in no apparent distress; mood and affect are within normal limits.  A focused examination was performed including nose. Relevant physical exam findings are noted in the Assessment and Plan.  Objective  R nasal tip: 1.49mm blanching pink pap  Images       Assessment & Plan  Fibrous papule of face R nasal tip  With Telangiectasia (irritated)  Discussed ED vs topical abx cream  Start Metronidazole 0.75% cr qhs  Will observe for change. Recheck on f/up  metroNIDAZOLE (METROCREAM) 0.75 % cream - R nasal tip  Return in about 2 months (around 11/26/2020) for recheck nose.   I, Othelia Pulling, RMA, am acting as scribe for Brendolyn Patty, MD . Documentation: I have reviewed the above documentation for accuracy and completeness, and I agree with the above.  Brendolyn Patty MD

## 2020-12-12 ENCOUNTER — Ambulatory Visit: Payer: BC Managed Care – PPO | Admitting: Dermatology

## 2020-12-14 ENCOUNTER — Ambulatory Visit: Payer: BC Managed Care – PPO | Admitting: Adult Health

## 2021-03-27 ENCOUNTER — Other Ambulatory Visit: Payer: Self-pay | Admitting: Student

## 2021-03-27 DIAGNOSIS — Z1231 Encounter for screening mammogram for malignant neoplasm of breast: Secondary | ICD-10-CM

## 2021-04-20 ENCOUNTER — Ambulatory Visit
Admission: RE | Admit: 2021-04-20 | Discharge: 2021-04-20 | Disposition: A | Discharge status: Home / Self Care | Payer: BC Managed Care – PPO | Source: Ambulatory Visit | Attending: Student | Admitting: Student

## 2021-04-20 ENCOUNTER — Other Ambulatory Visit: Payer: Self-pay

## 2021-04-20 DIAGNOSIS — Z1231 Encounter for screening mammogram for malignant neoplasm of breast: Secondary | ICD-10-CM | POA: Insufficient documentation

## 2021-11-19 IMAGING — MG DIGITAL SCREENING BILAT W/ TOMO W/ CAD
8 series · 8 of 24 positions shown · non-contrast
Comparison: None.

CLINICAL DATA: Screening.

EXAM:
DIGITAL SCREENING BILATERAL MAMMOGRAM WITH TOMO AND CAD

[L MLO synth-2D]
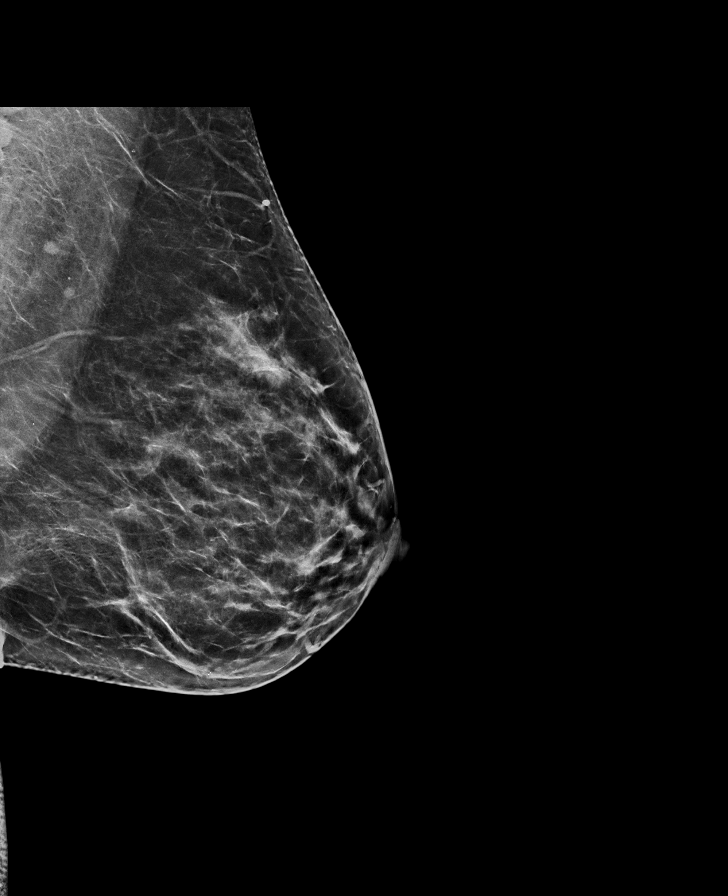

[R MLO synth-2D]
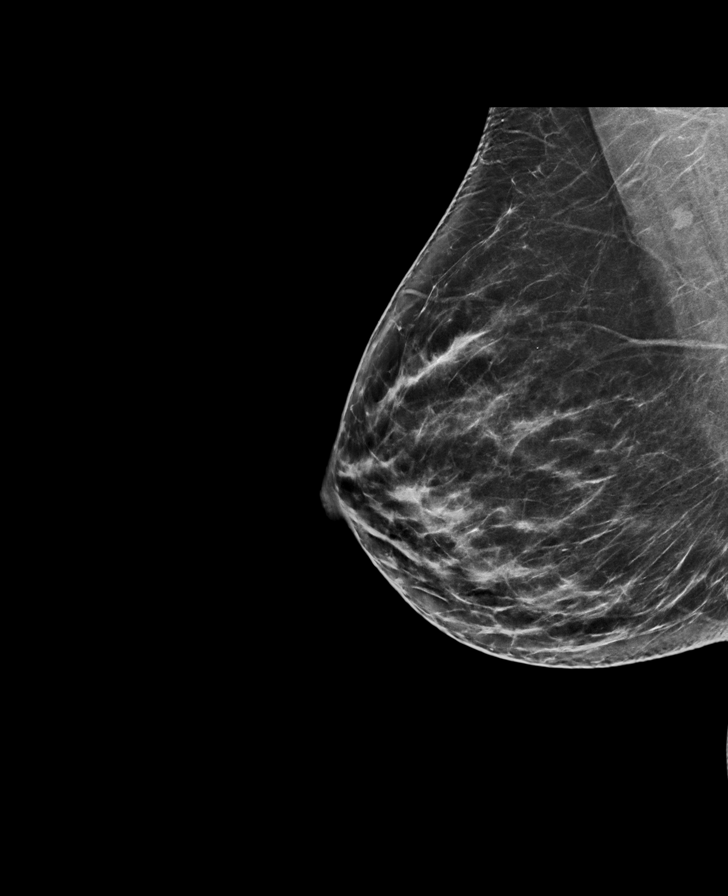

[R CC synth-2D]
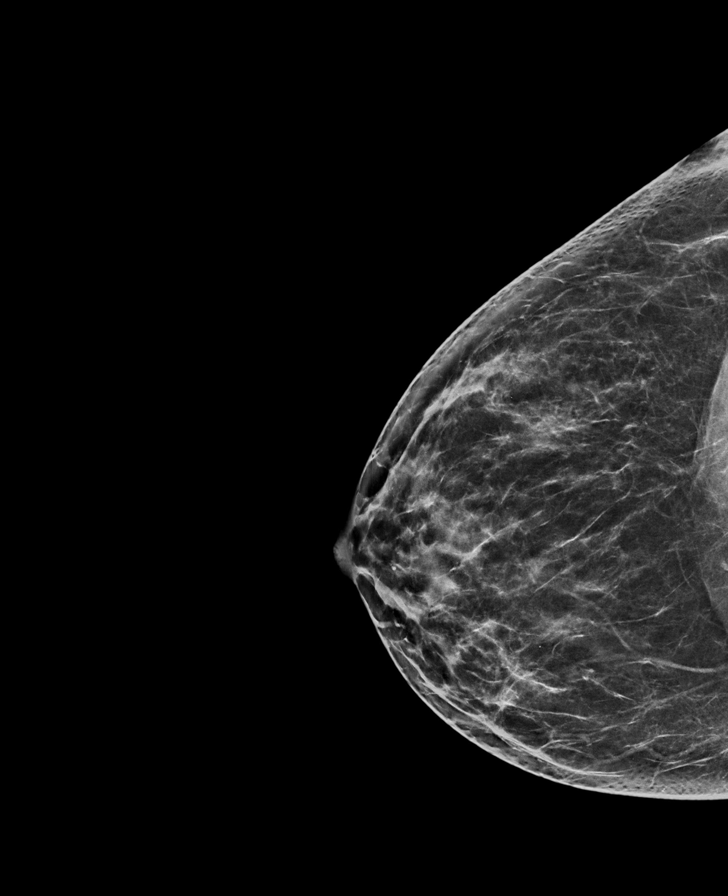

[L CC synth-2D]
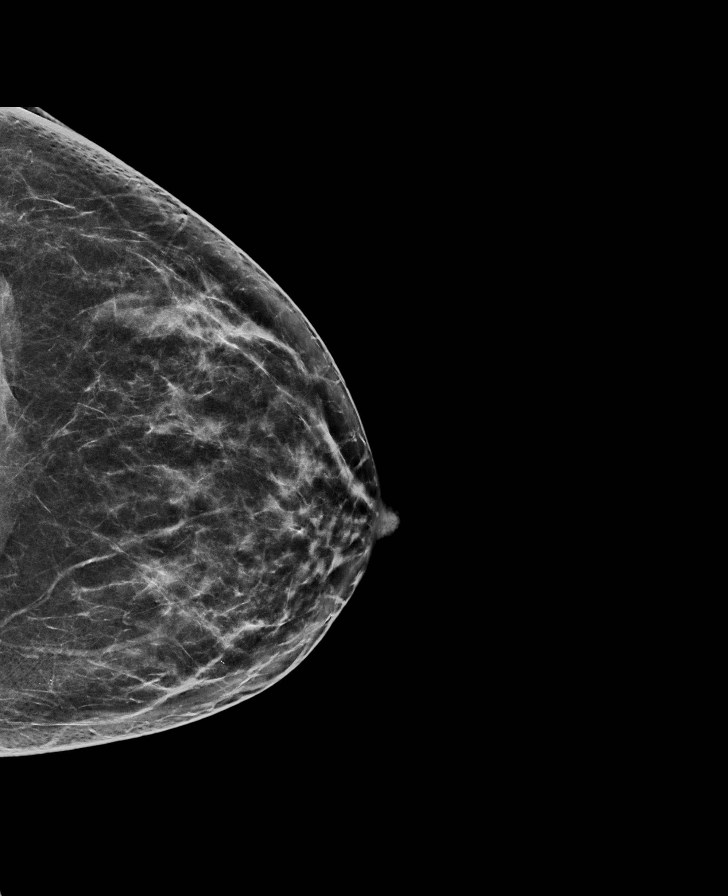

[L CC tomo · tomo slice 37/72.0]
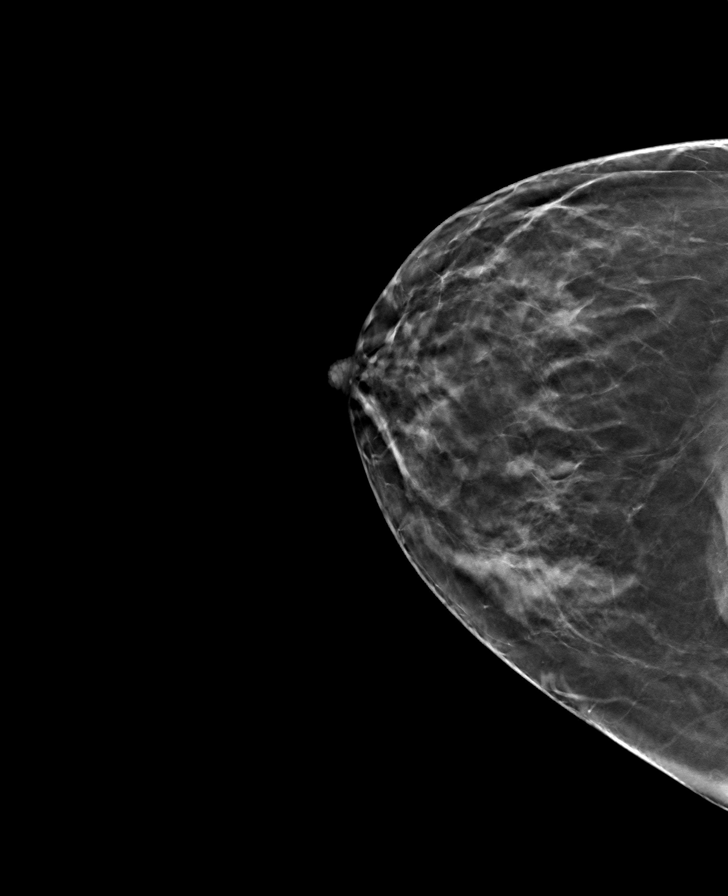

[R CC tomo · tomo slice 35/70.0]
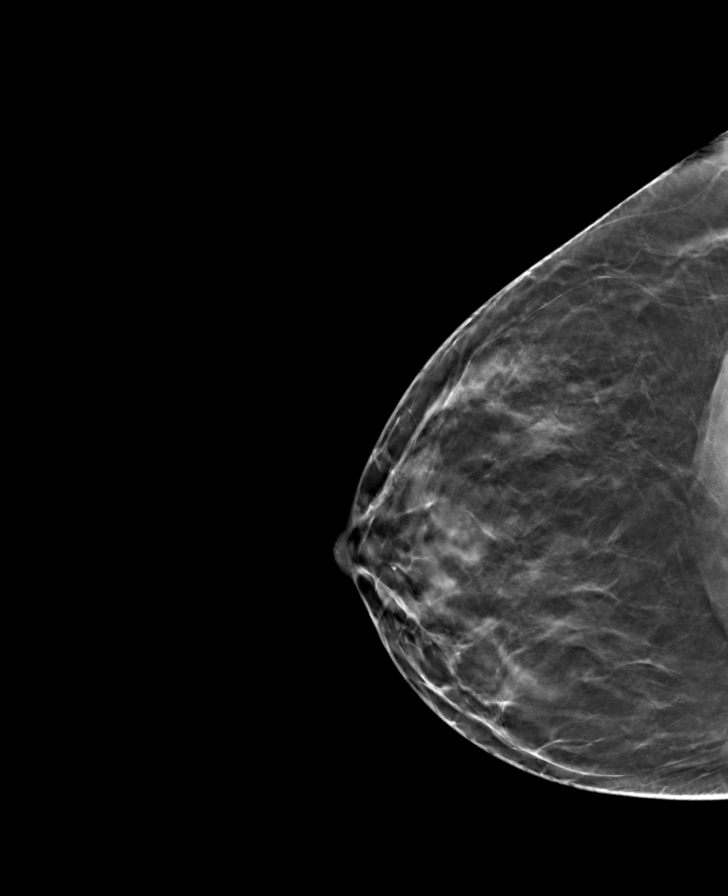

[R MLO tomo · tomo slice 37/74.0]
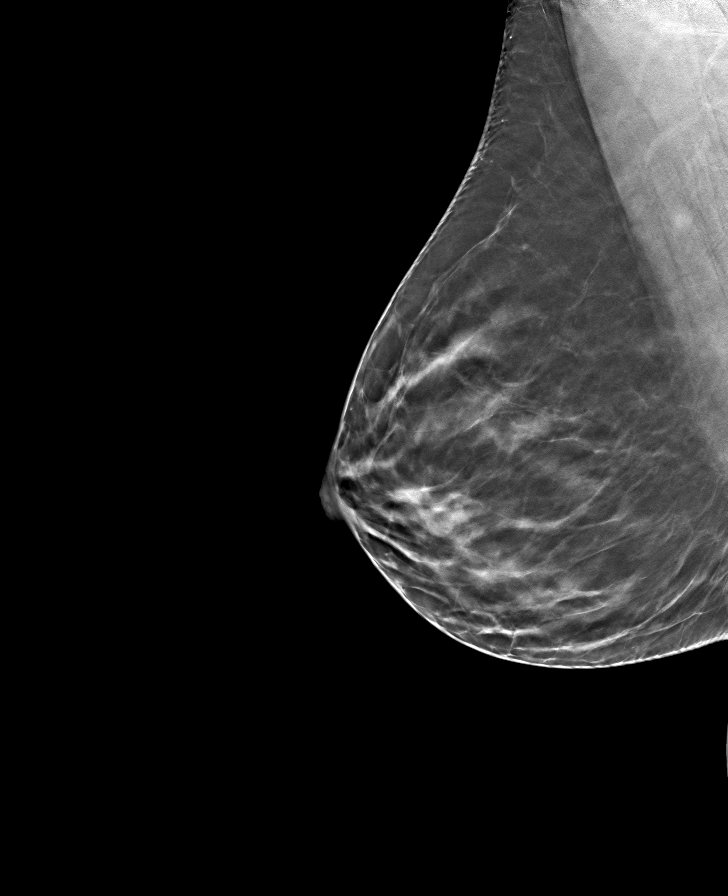

[L MLO tomo · tomo slice 39/77.0]
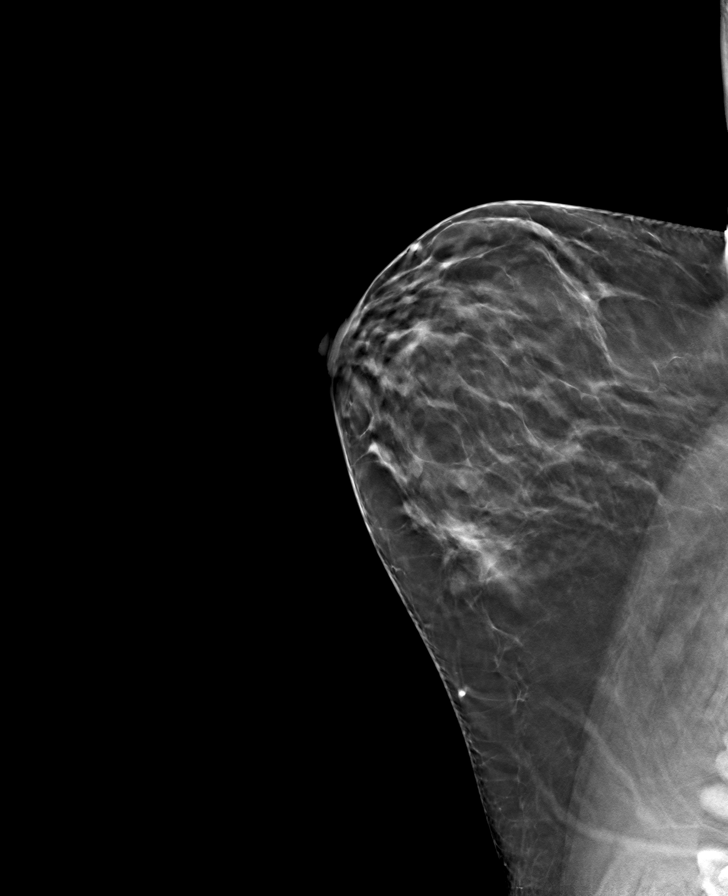

[8 of 24 positions shown; findings below may reference images not displayed]

ACR Breast Density Category c: The breast tissue is heterogeneously
dense, which may obscure small masses.
FINDINGS: In the left breast, a possible asymmetry warrants further
evaluation. Also in the left axilla a possible enlarged lymph nodes
warrant further evaluation. In the right breast, no findings
suspicious for malignancy. Images were processed with CAD.
IMPRESSION: Further evaluation is suggested for possible asymmetry in the left
breast and possible enlarged lymph nodes in the left axilla.

RECOMMENDATION:
1. Diagnostic mammogram and possibly ultrasound of the left breast.
(Code:L2-X-66X)

2.  Ultrasound of the left axilla.

The patient will be contacted regarding the findings, and additional
imaging will be scheduled.

BI-RADS CATEGORY  0: Incomplete. Need additional imaging evaluation
and/or prior mammograms for comparison.

## 2021-12-05 IMAGING — US US BREAST*L* LIMITED INC AXILLA
1 series · 6 of 6 positions shown · non-contrast
Comparison: Previous exam(s).

CLINICAL DATA: 39-year-old female presenting as a recall from
screening for possible left breast asymmetry and possible enlarged
left axillary lymph nodes.

EXAM:
DIGITAL DIAGNOSTIC LEFT MAMMOGRAM WITH TOMO
ULTRASOUND LEFT BREAST

[Series 1: us breast*left* limited inc axilla · 0.08mm/px · 6 of 6 slices shown]
[im 1/6]
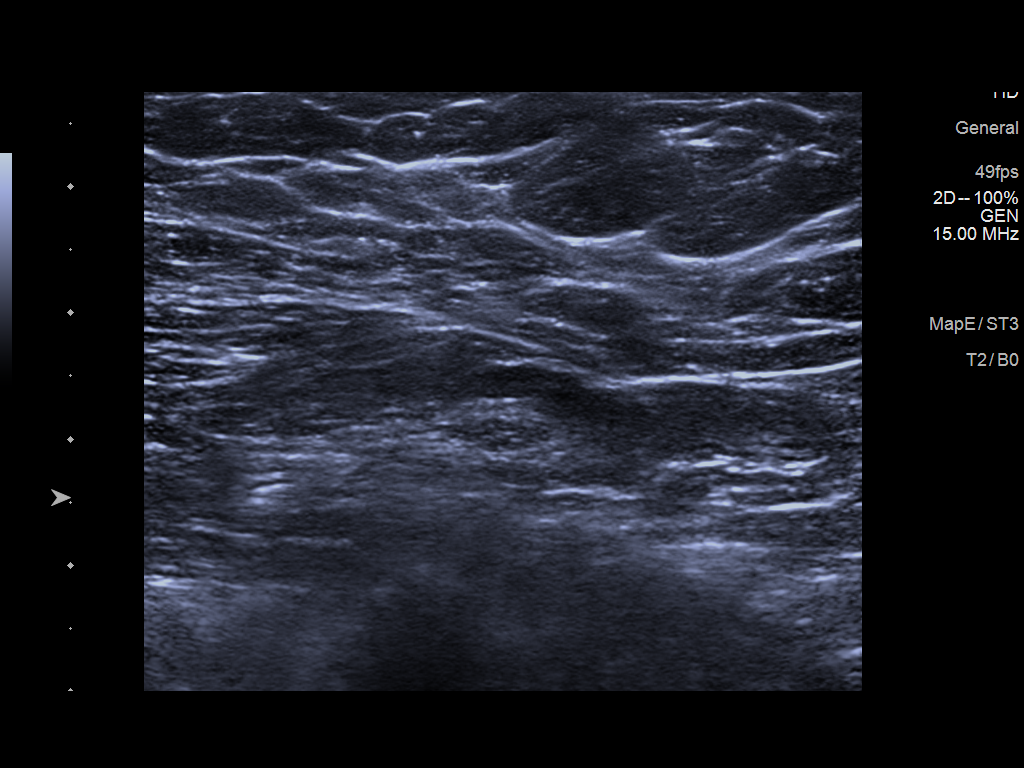
[im 2/6]
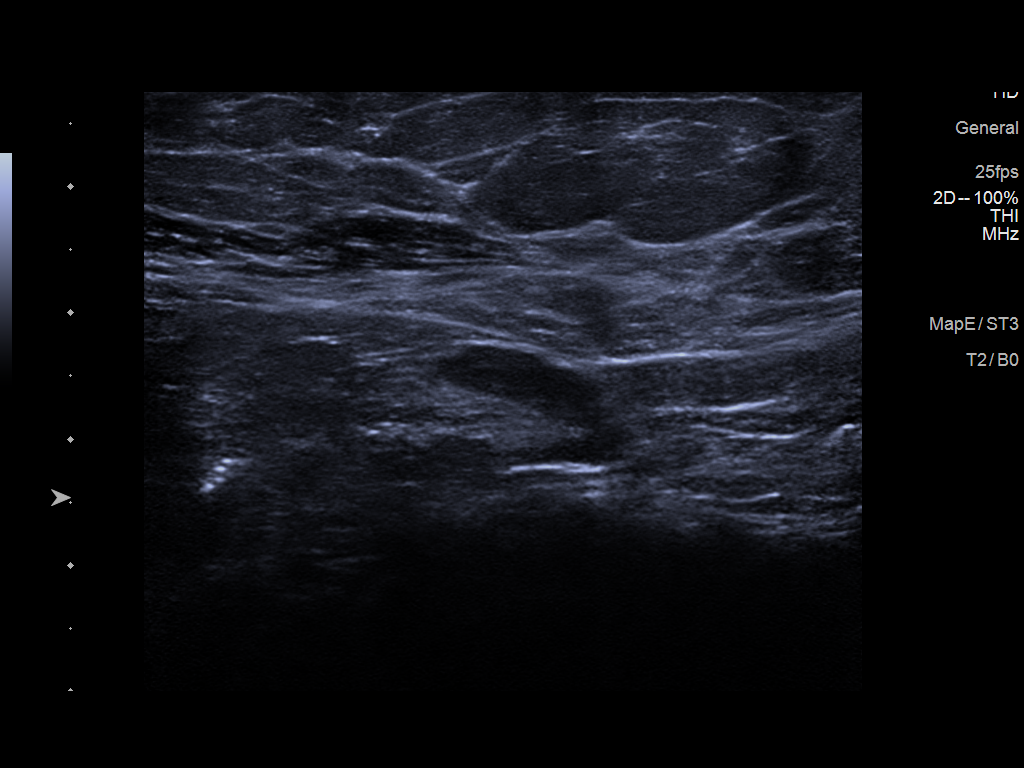
[im 3/6]
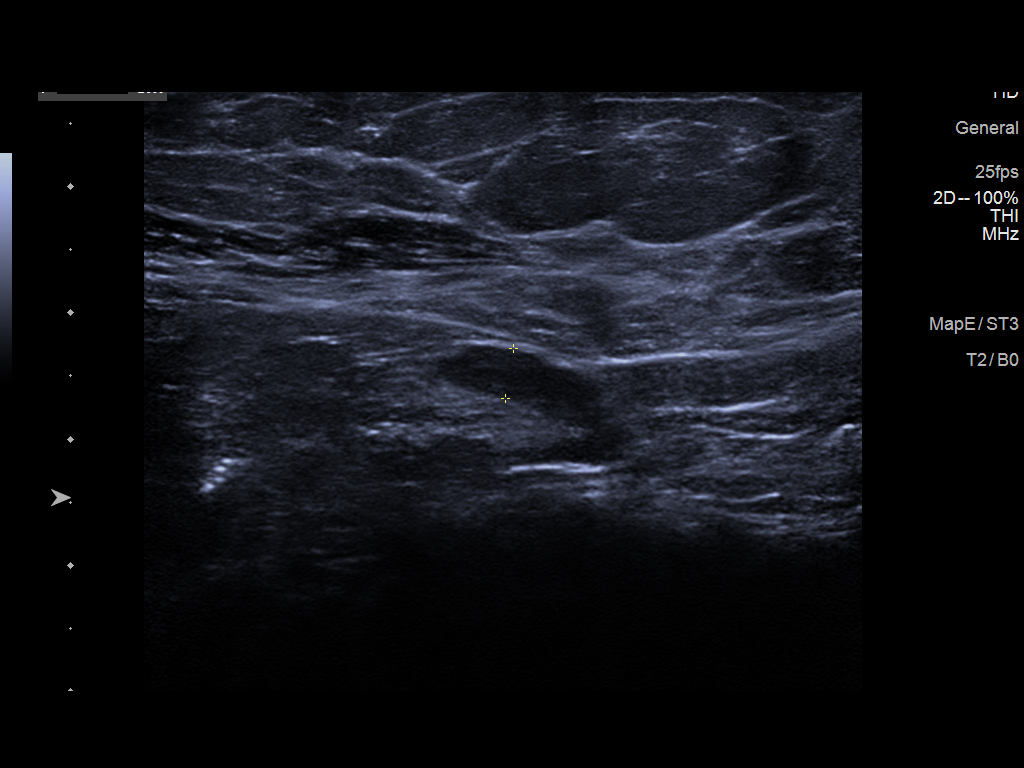
[im 4/6]
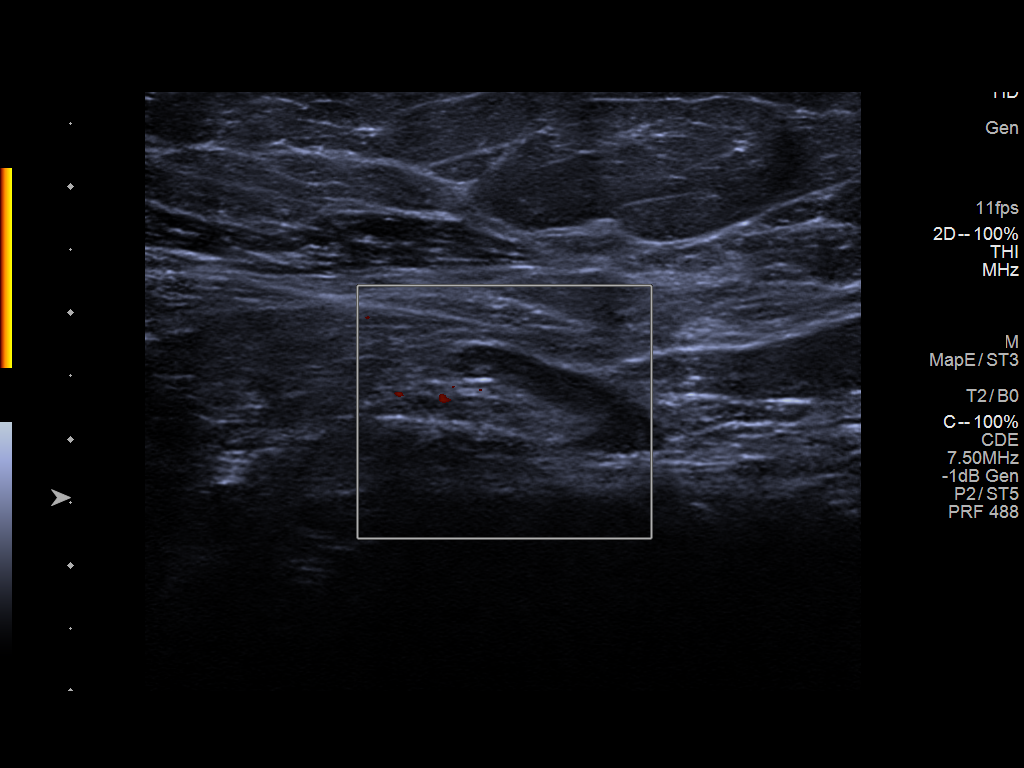
[im 5/6]
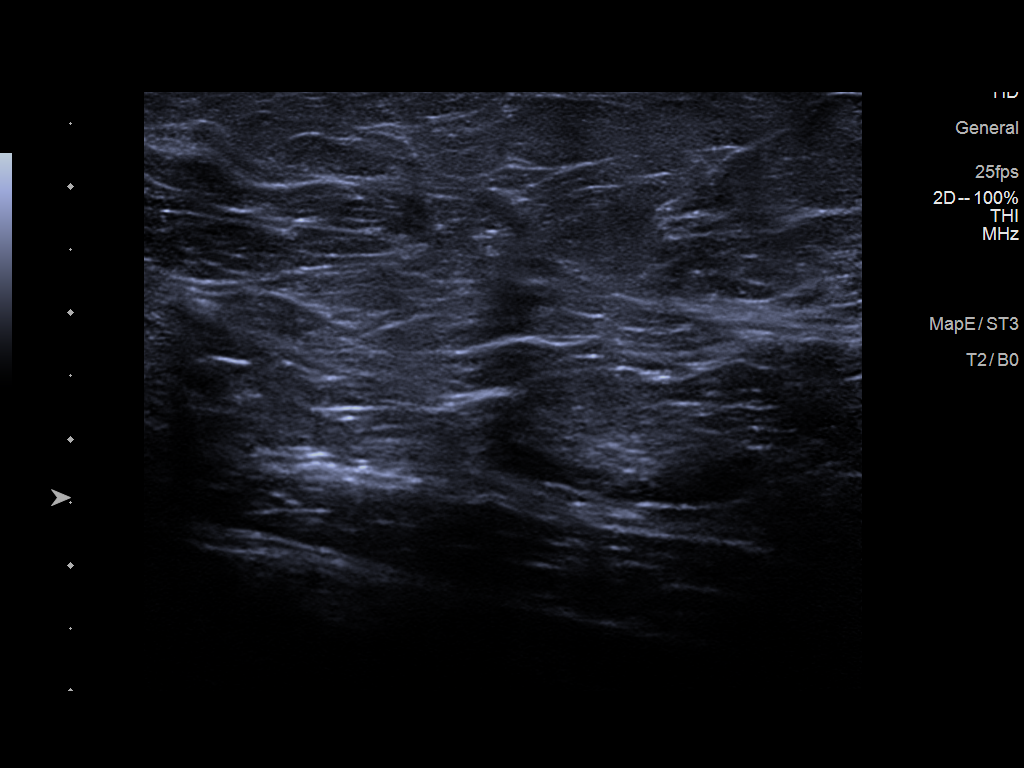
[im 6/6]
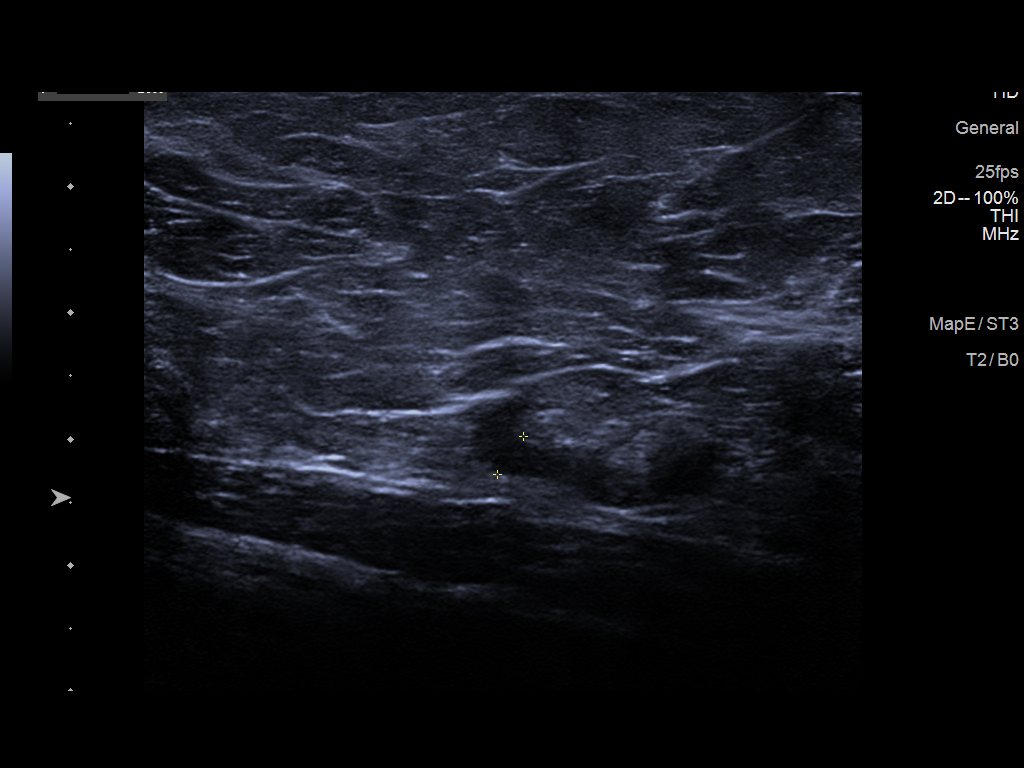

[6 of 6 positions shown; findings below may reference images not displayed]

ACR Breast Density Category c: The breast tissue is heterogeneously
dense, which may obscure small masses.
FINDINGS: Mammogram:

CC spot compression and full field mL tomosynthesis views of the
left breast performed for the questioned asymmetry seen on screening
mammogram only in the cc view in the medial breast. On the
additional imaging the tissue in this area disperses without
persistent asymmetry, mass or distortion. The screening finding most
likely represented normal overlapping fibroglandular tissue. There
are no additional new findings in the left breast.

On physical exam, I do not palpate a discrete mass in the medial
aspect of the left breast.

Ultrasound:

Targeted ultrasound is performed in the medial aspect of the left
breast demonstrating no cystic or solid mass.

Targeted ultrasound of the left axilla demonstrates several lymph
nodes with borderline cortical thickening measuring up to 0.4 cm.
There is normal morphology with retention of the fatty hila.
IMPRESSION: 1. Resolution of the asymmetry seen on screening mammogram in the
medial left breast, most consistent with normal overlapping
fibroglandular tissue.

2. Probably benign left axillary lymph nodes with borderline
cortical thickening. Patient states she had her flu shot just prior
to her screening mammogram and that her lymph nodes always enlarged
after vaccinations. She feels that they are decreasing in size.

RECOMMENDATION:
Left axillary ultrasound in 3 months for the probably benign left
axillary lymph nodes.

I have discussed the findings and recommendations with the patient.
If applicable, a reminder letter will be sent to the patient
regarding the next appointment.

BI-RADS CATEGORY  3: Probably benign.

## 2022-03-12 ENCOUNTER — Other Ambulatory Visit: Payer: Self-pay | Admitting: Student

## 2022-03-12 ENCOUNTER — Other Ambulatory Visit: Payer: Self-pay | Admitting: Internal Medicine

## 2022-03-12 DIAGNOSIS — Z1231 Encounter for screening mammogram for malignant neoplasm of breast: Secondary | ICD-10-CM

## 2022-04-23 ENCOUNTER — Ambulatory Visit
Admission: RE | Admit: 2022-04-23 | Discharge: 2022-04-23 | Disposition: A | Discharge status: Home / Self Care | Payer: BC Managed Care – PPO | Source: Ambulatory Visit | Attending: Student | Admitting: Student

## 2022-04-23 DIAGNOSIS — Z1231 Encounter for screening mammogram for malignant neoplasm of breast: Secondary | ICD-10-CM | POA: Diagnosis not present

## 2022-09-03 ENCOUNTER — Ambulatory Visit (INDEPENDENT_AMBULATORY_CARE_PROVIDER_SITE_OTHER): Payer: No Typology Code available for payment source | Admitting: Dermatology

## 2022-09-03 DIAGNOSIS — D229 Melanocytic nevi, unspecified: Secondary | ICD-10-CM

## 2022-09-03 DIAGNOSIS — L820 Inflamed seborrheic keratosis: Secondary | ICD-10-CM

## 2022-09-03 DIAGNOSIS — D2271 Melanocytic nevi of right lower limb, including hip: Secondary | ICD-10-CM | POA: Diagnosis not present

## 2022-09-03 DIAGNOSIS — D2272 Melanocytic nevi of left lower limb, including hip: Secondary | ICD-10-CM | POA: Diagnosis not present

## 2022-09-03 DIAGNOSIS — Z85828 Personal history of other malignant neoplasm of skin: Secondary | ICD-10-CM

## 2022-09-03 NOTE — Progress Notes (Signed)
   Follow-Up Visit   Subjective  Sonia Conley is a 42 y.o. female who presents for the following: check spot (L lower leg, ~39yrs, looks flatter).   The following portions of the chart were reviewed this encounter and updated as appropriate:       Review of Systems:  No other skin or systemic complaints except as noted in HPI or Assessment and Plan.  Objective  Well appearing patient in no apparent distress; mood and affect are within normal limits.  A focused examination was performed including left lower leg. Relevant physical exam findings are noted in the Assessment and Plan.  left lateral pretibial 8 mm waxy light tan thin papule  legs Tan-brown and/or pink-flesh-colored symmetric macules and papules.     Assessment & Plan  Inflamed seborrheic keratosis left lateral pretibial  Has improved  Reassured benign age-related growth.  Recommend observation.  Discussed cryotherapy if spot(s) become irritated or inflamed.   Not bothersome for patient at this time.  Nevus legs  Benign-appearing.  Observation.  Call clinic for new or changing lesions.  Recommend daily use of broad spectrum spf 30+ sunscreen to sun-exposed areas.    History of Basal Cell Carcinoma of the Skin - No evidence of recurrence today - Recommend regular full body skin exams - Recommend daily broad spectrum sunscreen SPF 30+ to sun-exposed areas, reapply every 2 hours as needed.  - Call if any new or changing lesions are noted between office visits   Return in about 6 months (around 03/06/2023) for TBSE, Hx BCC.  I, Othelia Pulling, RMA, am acting as scribe for Brendolyn Patty, MD .  Documentation: I have reviewed the above documentation for accuracy and completeness, and I agree with the above.  Brendolyn Patty MD

## 2022-09-03 NOTE — Patient Instructions (Addendum)
Seborrheic Keratosis  What causes seborrheic keratoses? Seborrheic keratoses are harmless, common skin growths that first appear during adult life.  As time goes by, more growths appear.  Some people may develop a large number of them.  Seborrheic keratoses appear on both covered and uncovered body parts.  They are not caused by sunlight.  The tendency to develop seborrheic keratoses can be inherited.  They vary in color from skin-colored to gray, brown, or even black.  They can be either smooth or have a rough, warty surface.   Seborrheic keratoses are superficial and look as if they were stuck on the skin.  Under the microscope this type of keratosis looks like layers upon layers of skin.  That is why at times the top layer may seem to fall off, but the rest of the growth remains and re-grows.    Treatment Seborrheic keratoses do not need to be treated, but can easily be removed in the office.  Seborrheic keratoses often cause symptoms when they rub on clothing or jewelry.  Lesions can be in the way of shaving.  If they become inflamed, they can cause itching, soreness, or burning.  Removal of a seborrheic keratosis can be accomplished by freezing, burning, or surgery. If any spot bleeds, scabs, or grows rapidly, please return to have it checked, as these can be an indication of a skin cancer.  Due to recent changes in healthcare laws, you may see results of your pathology and/or laboratory studies on MyChart before the doctors have had a chance to review them. We understand that in some cases there may be results that are confusing or concerning to you. Please understand that not all results are received at the same time and often the doctors may need to interpret multiple results in order to provide you with the best plan of care or course of treatment. Therefore, we ask that you please give us 2 business days to thoroughly review all your results before contacting the office for clarification. Should  we see a critical lab result, you will be contacted sooner.   If You Need Anything After Your Visit  If you have any questions or concerns for your doctor, please call our main line at [phone] and press option 4 to reach your doctor's medical assistant. If no one answers, please leave a voicemail as directed and we will return your call as soon as possible. Messages left after 4 pm will be answered the following business day.   You may also send us a message via MyChart. We typically respond to MyChart messages within 1-2 business days.  For prescription refills, please ask your pharmacy to contact our office. Our fax number is [phone].  If you have an urgent issue when the clinic is closed that cannot wait until the next business day, you can page your doctor at the number below.    Please note that while we do our best to be available for urgent issues outside of office hours, we are not available 24/7.   If you have an urgent issue and are unable to reach us, you may choose to seek medical care at your doctor's office, retail clinic, urgent care center, or emergency room.  If you have a medical emergency, please immediately call 911 or go to the emergency department.  Pager Numbers  - Dr. Kowalski: [phone]  - Dr. Moye: [phone]  - Dr. Stewart: [phone]  In the event of inclement weather, please call our main line at   [phone] for an update on the status of any delays or closures.  Dermatology Medication Tips: Please keep the boxes that topical medications come in in order to help keep track of the instructions about where and how to use these. Pharmacies typically print the medication instructions only on the boxes and not directly on the medication tubes.   If your medication is too expensive, please contact our office at [phone] option 4 or send us a message through MyChart.   We are unable to tell what your co-pay for medications will be in  advance as this is different depending on your insurance coverage. However, we may be able to find a substitute medication at lower cost or fill out paperwork to get insurance to cover a needed medication.   If a prior authorization is required to get your medication covered by your insurance company, please allow us 1-2 business days to complete this process.  Drug prices often vary depending on where the prescription is filled and some pharmacies may offer cheaper prices.  The website www.goodrx.com contains coupons for medications through different pharmacies. The prices here do not account for what the cost may be with help from insurance (it may be cheaper with your insurance), but the website can give you the price if you did not use any insurance.  - You can print the associated coupon and take it with your prescription to the pharmacy.  - You may also stop by our office during regular business hours and pick up a GoodRx coupon card.  - If you need your prescription sent electronically to a different pharmacy, notify our office through [facility] MyChart or by phone at [phone] option 4.     Si Usted Necesita Algo Despus de Su Visita  Tambin puede enviarnos un mensaje a travs de MyChart. Por lo general respondemos a los mensajes de MyChart en el transcurso de 1 a 2 das hbiles.  Para renovar recetas, por favor pida a su farmacia que se ponga en contacto con nuestra oficina. Nuestro nmero de fax es el [phone].  Si tiene un asunto urgente cuando la clnica est cerrada y que no puede esperar hasta el siguiente da hbil, puede llamar/localizar a su doctor(a) al nmero que aparece a continuacin.   Por favor, tenga en cuenta que aunque hacemos todo lo posible para estar disponibles para asuntos urgentes fuera del horario de oficina, no estamos disponibles las 24 horas del da, los 7 das de la semana.   Si tiene un problema urgente y no puede comunicarse con nosotros, puede  optar por buscar atencin mdica  en el consultorio de su doctor(a), en una clnica privada, en un centro de atencin urgente o en una sala de emergencias.  Si tiene una emergencia mdica, por favor llame inmediatamente al 911 o vaya a la sala de emergencias.  Nmeros de bper  - Dr. Kowalski: [phone]  - Dra. Moye: [phone]  - Dra. Stewart: [phone]  En caso de inclemencias del tiempo, por favor llame a nuestra lnea principal al [phone] para una actualizacin sobre el estado de cualquier retraso o cierre.  Consejos para la medicacin en dermatologa: Por favor, guarde las cajas en las que vienen los medicamentos de uso tpico para ayudarle a seguir las instrucciones sobre dnde y cmo usarlos. Las farmacias generalmente imprimen las instrucciones del medicamento slo en las cajas y no directamente en los tubos del medicamento.   Si su medicamento es muy caro, por favor, pngase en contacto con   nuestra oficina llamando al [phone] y presione la opcin 4 o envenos un mensaje a travs de MyChart.   No podemos decirle cul ser su copago por los medicamentos por adelantado ya que esto es diferente dependiendo de la cobertura de su seguro. Sin embargo, es posible que podamos encontrar un medicamento sustituto a menor costo o llenar un formulario para que el seguro cubra el medicamento que se considera necesario.   Si se requiere una autorizacin previa para que su compaa de seguros cubra su medicamento, por favor permtanos de 1 a 2 das hbiles para completar este proceso.  Los precios de los medicamentos varan con frecuencia dependiendo del lugar de dnde se surte la receta y alguna farmacias pueden ofrecer precios ms baratos.  El sitio web www.goodrx.com tiene cupones para medicamentos de diferentes farmacias. Los precios aqu no tienen en cuenta lo que podra costar con la ayuda del seguro (puede ser ms barato con su seguro), pero el sitio web puede darle el  precio si no utiliz ningn seguro.  - Puede imprimir el cupn correspondiente y llevarlo con su receta a la farmacia.  - Tambin puede pasar por nuestra oficina durante el horario de atencin regular y recoger una tarjeta de cupones de GoodRx.  - Si necesita que su receta se enve electrnicamente a una farmacia diferente, informe a nuestra oficina a travs de MyChart de [facility] o por telfono llamando al [phone] y presione la opcin 4.  

## 2023-03-27 ENCOUNTER — Other Ambulatory Visit: Payer: Self-pay | Admitting: Student

## 2023-03-27 DIAGNOSIS — Z1231 Encounter for screening mammogram for malignant neoplasm of breast: Secondary | ICD-10-CM

## 2023-04-02 ENCOUNTER — Ambulatory Visit: Payer: No Typology Code available for payment source | Admitting: Dermatology

## 2023-04-02 VITALS — BP 144/84 | HR 105

## 2023-04-02 DIAGNOSIS — L570 Actinic keratosis: Secondary | ICD-10-CM | POA: Diagnosis not present

## 2023-04-02 DIAGNOSIS — Z1283 Encounter for screening for malignant neoplasm of skin: Secondary | ICD-10-CM

## 2023-04-02 DIAGNOSIS — W908XXA Exposure to other nonionizing radiation, initial encounter: Secondary | ICD-10-CM

## 2023-04-02 DIAGNOSIS — S90861A Insect bite (nonvenomous), right foot, initial encounter: Secondary | ICD-10-CM | POA: Diagnosis not present

## 2023-04-02 DIAGNOSIS — Z85828 Personal history of other malignant neoplasm of skin: Secondary | ICD-10-CM

## 2023-04-02 DIAGNOSIS — L578 Other skin changes due to chronic exposure to nonionizing radiation: Secondary | ICD-10-CM | POA: Diagnosis not present

## 2023-04-02 DIAGNOSIS — D1801 Hemangioma of skin and subcutaneous tissue: Secondary | ICD-10-CM

## 2023-04-02 DIAGNOSIS — W57XXXA Bitten or stung by nonvenomous insect and other nonvenomous arthropods, initial encounter: Secondary | ICD-10-CM

## 2023-04-02 DIAGNOSIS — L821 Other seborrheic keratosis: Secondary | ICD-10-CM

## 2023-04-02 DIAGNOSIS — D229 Melanocytic nevi, unspecified: Secondary | ICD-10-CM

## 2023-04-02 DIAGNOSIS — L719 Rosacea, unspecified: Secondary | ICD-10-CM

## 2023-04-02 DIAGNOSIS — L814 Other melanin hyperpigmentation: Secondary | ICD-10-CM

## 2023-04-02 DIAGNOSIS — D225 Melanocytic nevi of trunk: Secondary | ICD-10-CM

## 2023-04-02 MED ORDER — CLOBETASOL PROPIONATE 0.05 % EX CREA
TOPICAL_CREAM | CUTANEOUS | 0 refills | Status: AC
Start: 2023-04-02 — End: ?

## 2023-04-02 NOTE — Patient Instructions (Addendum)
Cryotherapy Aftercare  Wash gently with soap and water everyday.   Apply Vaseline and Band-Aid daily until healed.   Melanoma ABCDEs  Melanoma is the most dangerous type of skin cancer, and is the leading cause of death from skin disease.  You are more likely to develop melanoma if you: Have light-colored skin, light-colored eyes, or red or blond hair Spend a lot of time in the sun Tan regularly, either outdoors or in a tanning bed Have had blistering sunburns, especially during childhood Have a close family member who has had a melanoma Have atypical moles or large birthmarks  Early detection of melanoma is key since treatment is typically straightforward and cure rates are extremely high if we catch it early.   The first sign of melanoma is often a change in a mole or a new dark spot.  The ABCDE system is a way of remembering the signs of melanoma.  A for asymmetry:  The two halves do not match. B for border:  The edges of the growth are irregular. C for color:  A mixture of colors are present instead of an even brown color. D for diameter:  Melanomas are usually (but not always) greater than 6mm - the size of a pencil eraser. E for evolution:  The spot keeps changing in size, shape, and color.  Please check your skin once per month between visits. You can use a small mirror in front and a large mirror behind you to keep an eye on the back side or your body.   If you see any new or changing lesions before your next follow-up, please call to schedule a visit.  Please continue daily skin protection including broad spectrum sunscreen SPF 30+ to sun-exposed areas, reapplying every 2 hours as needed when you're outdoors.   Staying in the shade or wearing long sleeves, sun glasses (UVA+UVB protection) and wide brim hats (4-inch brim around the entire circumference of the hat) are also recommended for sun protection.    Due to recent changes in healthcare laws, you may see results of your  pathology and/or laboratory studies on MyChart before the doctors have had a chance to review them. We understand that in some cases there may be results that are confusing or concerning to you. Please understand that not all results are received at the same time and often the doctors may need to interpret multiple results in order to provide you with the [place] plan of care or course of treatment. Therefore, we ask that you please give [place] 2 business days to thoroughly review all your results before contacting the office for clarification. Should we see a critical lab result, you will be contacted sooner.   If You Need Anything After Your Visit  If you have any questions or concerns for your doctor, please call our main line at [phone] and press option 4 to reach your doctor's medical assistant. If no one answers, please leave a voicemail as directed and we will return your call as soon as possible. Messages left after 4 pm will be answered the following business day.   You may also send [place] a message via MyChart. We typically respond to MyChart messages within 1-2 business days.  For prescription refills, please ask your pharmacy to contact our office. Our fax number is [phone].  If you have an urgent issue when the clinic is closed that cannot wait until the next business day, you can page your doctor at the number below.  Please note that while we do our [place] to be available for urgent issues outside of office hours, we are not available 24/7.   If you have an urgent issue and are unable to reach [place], you may choose to seek medical care at your doctor's office, retail clinic, urgent care center, or emergency room.  If you have a medical emergency, please immediately call 911 or go to the emergency department.  Pager Numbers  - Dr. Gwen Pounds: [phone]  - Dr. Roseanne Reno: [phone]  - Dr. Katrinka Blazing: [phone]   In the event of inclement weather, please call our main line at  [phone] for an update on the status of any delays or closures.  Dermatology Medication Tips: Please keep the boxes that topical medications come in in order to help keep track of the instructions about where and how to use these. Pharmacies typically print the medication instructions only on the boxes and not directly on the medication tubes.   If your medication is too expensive, please contact our office at [phone] option 4 or send [place] a message through MyChart.   We are unable to tell what your co-pay for medications will be in advance as this is different depending on your insurance coverage. However, we may be able to find a substitute medication at lower cost or fill out paperwork to get insurance to cover a needed medication.   If a prior authorization is required to get your medication covered by your insurance company, please allow [place] 1-2 business days to complete this process.  Drug prices often vary depending on where the prescription is filled and some pharmacies may offer cheaper prices.  The website www.goodrx.com contains coupons for medications through different pharmacies. The prices here do not account for what the cost may be with help from insurance (it may be cheaper with your insurance), but the website can give you the price if you did not use any insurance.  - You can print the associated coupon and take it with your prescription to the pharmacy.  - You may also stop by our office during regular business hours and pick up a GoodRx coupon card.  - If you need your prescription sent electronically to a different pharmacy, notify our office through [facility] or by phone at [phone] option 4.     Si Usted Necesita Algo Despus de Su Visita  Tambin puede enviarnos un mensaje a travs de Clinical cytogeneticist. Por lo general respondemos a los mensajes de MyChart en el transcurso de 1 a 2 das hbiles.  Para renovar recetas, por favor pida a su farmacia que se  ponga en contacto con nuestra oficina. Annie Sable de fax es [place] [phone].  Si tiene un asunto urgente cuando la clnica est cerrada y que no puede esperar hasta el siguiente da hbil, puede llamar/localizar a su doctor(a) al nmero que aparece a continuacin.   Por favor, tenga en cuenta que aunque hacemos todo lo posible para estar disponibles para asuntos urgentes fuera del horario de [place], no estamos disponibles las 24 horas del da, los 7 [address]  [address] de la [place].   Si tiene un problema urgente y no puede comunicarse con nosotros, puede optar por buscar atencin mdica  en el consultorio de su doctor(a), en una clnica privada, en un centro de atencin urgente o en una sala de emergencias.  Si tiene Engineer, drilling, por favor llame inmediatamente al 911 o vaya a la sala de emergencias.  Nmeros de bper  - Dr.  Gwen Pounds: [phone]  - Dra. Roseanne Reno: [phone]  - Dr. Katrinka Blazing: [phone]   En caso de inclemencias del tiempo, por favor llame a Lacy Duverney principal al [phone] para una actualizacin sobre el [place] de cualquier retraso o cierre.  Consejos para la medicacin en dermatologa: Por favor, guarde las cajas en las que vienen los medicamentos de uso tpico para ayudarle a seguir las instrucciones sobre dnde y cmo usarlos. Las farmacias generalmente imprimen las instrucciones del medicamento slo en las cajas y no directamente en los tubos del [place].   Si su medicamento es muy caro, por favor, pngase en contacto con Rolm Gala llamando al [phone] y presione la opcin 4 o envenos un mensaje a travs de Clinical cytogeneticist.   No podemos decirle cul ser su copago por los medicamentos por adelantado ya que esto es diferente dependiendo de la cobertura de su seguro. Sin embargo, es posible que podamos encontrar un medicamento sustituto a Audiological scientist un formulario para que el seguro cubra el medicamento que se considera necesario.   Si se requiere  una autorizacin previa para que su compaa de seguros [place] su medicamento, por favor permtanos de 1 a 2 das hbiles para completar [address]  Los precios de los medicamentos varan con frecuencia dependiendo del Environmental consultant de dnde se surte la receta y alguna farmacias pueden ofrecer precios ms baratos.  El sitio web www.goodrx.com tiene cupones para medicamentos de Health and safety inspector. Los precios aqu no tienen en cuenta lo que podra costar con la ayuda del seguro (puede ser ms barato con su seguro), pero el sitio web puede darle el precio si no utiliz Tourist information centre manager.  - Puede imprimir el cupn correspondiente y llevarlo con su receta a la farmacia.  - Tambin puede pasar por nuestra oficina durante el horario de atencin regular y Education officer, museum una tarjeta de cupones de GoodRx.  - Si necesita que su receta se enve electrnicamente a una farmacia diferente, informe a nuestra oficina a travs de MyChart de [place] o por telfono llamando al [phone] y presione la opcin 4.

## 2023-04-02 NOTE — Progress Notes (Signed)
Follow-Up Visit   Subjective  Sonia Conley is a 42 y.o. female who presents for the following: Skin Cancer Screening and Full Body Skin Exam  The patient presents for Total-Body Skin Exam (TBSE) for skin cancer screening and mole check. The patient has spots, moles and lesions to be evaluated, some may be new or changing and the patient may have concern these could be cancer. She has a history of BCC of the mid sternum, 2016. She has a pink spot on her left forehead, itchy at times, present for several months. No history of bleeding. Patient has fire ant bites on the feet and ankles, itchy.    The following portions of the chart were reviewed this encounter and updated as appropriate: medications, allergies, medical history  Review of Systems:  No other skin or systemic complaints except as noted in HPI or Assessment and Plan.  Objective  Well appearing patient in no apparent distress; mood and affect are within normal limits.  A full examination was performed including scalp, head, eyes, ears, nose, lips, neck, chest, axillae, abdomen, back, buttocks, bilateral upper extremities, bilateral lower extremities, hands, feet, fingers, toes, fingernails, and toenails. All findings within normal limits unless otherwise noted below.   Relevant physical exam findings are noted in the Assessment and Plan.  Left Forehead Pink scaly macule.  Right Foot - Anterior Pink edematous papules on the feet and ankles.    Assessment & Plan   SKIN CANCER SCREENING PERFORMED TODAY.  ACTINIC DAMAGE - Chronic condition, secondary to cumulative UV/sun exposure - diffuse scaly erythematous macules with underlying dyspigmentation - Recommend daily broad spectrum sunscreen SPF 30+ to sun-exposed areas, reapply every 2 hours as needed.  - Staying in the shade or wearing long sleeves, sun glasses (UVA+UVB protection) and wide brim hats (4-inch brim around the entire circumference of the hat) are also  recommended for sun protection.  - Call for new or changing lesions.  LENTIGINES, SEBORRHEIC KERATOSES, HEMANGIOMAS - Benign normal skin lesions - Benign-appearing - Call for any changes  MELANOCYTIC NEVI - Tan-brown and/or pink-flesh-colored symmetric macules and papules - 4.0 mm speckled brown macule on the left medial breast - 2.0 mm med dark brown macule on the left lower abdomen - 4.0 mm med dark brown macule on the left abdomen - Benign appearing on exam today - Observation - Call clinic for new or changing moles - Recommend daily use of broad spectrum spf 30+ sunscreen to sun-exposed areas.   HISTORY OF BASAL CELL CARCINOMA OF THE SKIN Mid sternum, 2016 - No evidence of recurrence today - Recommend regular full body skin exams - Recommend daily broad spectrum sunscreen SPF 30+ to sun-exposed areas, reapply every 2 hours as needed.  - Call if any new or changing lesions are noted between office visits  AK (actinic keratosis) Left Forehead  Actinic keratoses are precancerous spots that appear secondary to cumulative UV radiation exposure/sun exposure over time. They are chronic with expected duration over 1 year. A portion of actinic keratoses will progress to squamous cell carcinoma of the skin. It is not possible to reliably predict which spots will progress to skin cancer and so treatment is recommended to prevent development of skin cancer.  Recommend daily broad spectrum sunscreen SPF 30+ to sun-exposed areas, reapply every 2 hours as needed.  Recommend staying in the shade or wearing long sleeves, sun glasses (UVA+UVB protection) and wide brim hats (4-inch brim around the entire circumference of the hat). Call for new  or changing lesions.  Destruction of lesion - Left Forehead  Destruction method: cryotherapy   Informed consent: discussed and consent obtained   Lesion destroyed using liquid nitrogen: Yes   Region frozen until ice ball extended beyond lesion: Yes    Outcome: patient tolerated procedure well with no complications   Post-procedure details: wound care instructions given   Additional details:  Prior to procedure, discussed risks of blister formation, small wound, skin dyspigmentation, or rare scar following cryotherapy. Recommend Vaseline ointment to treated areas while healing.   Bug bite without infection, initial encounter Right Foot - Anterior  Start clobetasol cream Apply to AA bites twice daily until improved dsp 30g 0Rf. Avoid applying to face, groin, and axilla. Use as directed. Long-term use can cause thinning of the skin.  Topical steroids (such as triamcinolone, fluocinolone, fluocinonide, mometasone, clobetasol, halobetasol, betamethasone, hydrocortisone) can cause thinning and lightening of the skin if they are used for too long in the same area. Your physician has selected the right strength medicine for your problem and area affected on the body. Please use your medication only as directed by your physician to prevent side effects.     clobetasol cream (TEMOVATE) 0.05 % - Right Foot - Anterior Apply to affected area bites on feet and ankles twice daily until improved. Avoid applying to face, groin, and axilla. Use as directed. Long-term use can cause thinning of the skin.  ROSACEA Exam Mid face erythema with telangiectasias .  Chronic and persistent condition with duration or expected duration over one year. Not currently at goal. But condition not bothersome to patient.    Rosacea is a chronic progressive skin condition usually affecting the face of adults, causing redness and/or acne bumps. It is treatable but not curable. It sometimes affects the eyes (ocular rosacea) as well. It may respond to topical and/or systemic medication and can flare with stress, sun exposure, alcohol, exercise, topical steroids (including hydrocortisone/cortisone 10) and some foods.  Daily application of broad spectrum spf 30+ sunscreen to face  is recommended to reduce flares.  Patient denies grittiness of the eyes  Treatment Plan Discussed treatment, patient defers today.   Return in about 1 year (around 04/01/2024) for TBSE, Hx BCC, Hx AKs.    Documentation: I have reviewed the above documentation for accuracy and completeness, and I agree with the above.  Willeen Niece, MD

## 2023-04-29 ENCOUNTER — Ambulatory Visit
Admission: RE | Admit: 2023-04-29 | Discharge: 2023-04-29 | Disposition: A | Discharge status: Home / Self Care | Payer: No Typology Code available for payment source | Source: Ambulatory Visit | Attending: Student | Admitting: Student

## 2023-04-29 DIAGNOSIS — Z1231 Encounter for screening mammogram for malignant neoplasm of breast: Secondary | ICD-10-CM | POA: Diagnosis present

## 2024-03-18 ENCOUNTER — Other Ambulatory Visit: Payer: Self-pay | Admitting: Student

## 2024-03-18 DIAGNOSIS — Z1231 Encounter for screening mammogram for malignant neoplasm of breast: Secondary | ICD-10-CM

## 2024-04-01 ENCOUNTER — Ambulatory Visit: Admitting: Dermatology

## 2024-04-07 ENCOUNTER — Ambulatory Visit: Payer: No Typology Code available for payment source | Admitting: Dermatology

## 2024-04-29 ENCOUNTER — Ambulatory Visit
Admission: RE | Admit: 2024-04-29 | Discharge: 2024-04-29 | Disposition: A | Discharge status: Home / Self Care | Source: Ambulatory Visit | Attending: Student | Admitting: Student

## 2024-04-29 DIAGNOSIS — Z1231 Encounter for screening mammogram for malignant neoplasm of breast: Secondary | ICD-10-CM | POA: Insufficient documentation

## 2024-09-07 ENCOUNTER — Encounter: Admitting: Dermatology
